# Patient Record
Sex: Male | Born: 1975 | Race: White | Hispanic: No | Marital: Married | Smoking: Former smoker
Health system: Southern US, Community
[De-identification: ages and names within clinical notes are randomized; demographics above are authoritative.]

## PROBLEM LIST (undated history)

## (undated) DIAGNOSIS — E061 Subacute thyroiditis: Secondary | ICD-10-CM

## (undated) DIAGNOSIS — K56609 Unspecified intestinal obstruction, unspecified as to partial versus complete obstruction: Secondary | ICD-10-CM

## (undated) DIAGNOSIS — Z9852 Vasectomy status: Secondary | ICD-10-CM

## (undated) DIAGNOSIS — E782 Mixed hyperlipidemia: Secondary | ICD-10-CM

## (undated) HISTORY — DX: Unspecified intestinal obstruction, unspecified as to partial versus complete obstruction: K56.609

## (undated) HISTORY — PX: VASECTOMY: SHX75

## (undated) HISTORY — DX: Mixed hyperlipidemia: E78.2

## (undated) HISTORY — DX: Subacute thyroiditis: E06.1

## (undated) HISTORY — DX: Vasectomy status: Z98.52

## (undated) HISTORY — PX: TONSILLECTOMY: SUR1361

---

## 2001-08-22 HISTORY — PX: LASIK: SHX215

## 2012-09-04 ENCOUNTER — Encounter: Payer: Self-pay | Admitting: Family Medicine

## 2012-09-04 ENCOUNTER — Ambulatory Visit (INDEPENDENT_AMBULATORY_CARE_PROVIDER_SITE_OTHER): Payer: BC Managed Care – PPO | Admitting: Family Medicine

## 2012-09-04 VITALS — BP 130/84 | HR 73 | Temp 98.0°F | Ht 73.0 in | Wt 208.0 lb

## 2012-09-04 DIAGNOSIS — R040 Epistaxis: Secondary | ICD-10-CM

## 2012-09-04 DIAGNOSIS — Z23 Encounter for immunization: Secondary | ICD-10-CM

## 2012-09-04 NOTE — Assessment & Plan Note (Signed)
Benign, related to dry winter air + recent URI w/excessive blowing of nose leading to impaired healing. Reassured pt today. Discussed use of saline moisturizing spray, aquafor/ointment for barrier protection of nasal mucosa, and afrin twice daily for the next 3d.

## 2012-09-04 NOTE — Progress Notes (Signed)
Office Note 09/04/2012  CC:  Chief Complaint  Patient presents with  . Establish Care    nosebleeds daily, usually in winter, but worse this year    HPI:  Edwin Sparks is a 37 y.o. White male who is here to establish care and discuss nosebleeds. Patient's most recent primary MD: none Old records were not reviewed prior to or during today's visit.  Describes hx of nosebleeds in winter, when dry and only lasts a few days usually. This winter they have worse: for 3 weeks they have been daily.  They are always in right nostril and stop with 30 sec's of pressure.   No hx of trauma to nose.  No use of meds/sprays in nose.  Lately has had 2 wks of nasal congestion and this aggravated the condition due to excessive nose blowing.  Now, lately he notes spicy foods triggering nose bleeds. No known environmental allergens.  No bleeding noted in stool or urine. No HF of clotting dysfunction.   Past Medical History  Diagnosis Date  . Chicken pox     Past Surgical History  Procedure Date  . Lasik 2003    Family History  Problem Relation Age of Onset  . Lymphoma Father 23    NHL; died age 14    History   Social History  . Marital Status: Married    Spouse Name: N/A    Number of Children: N/A  . Years of Education: N/A   Occupational History  . Not on file.   Social History Main Topics  . Smoking status: Former Smoker    Quit date: 08/22/2001  . Smokeless tobacco: Never Used  . Alcohol Use: Yes     Comment: wine with dinner  . Drug Use: No  . Sexually Active: Not on file   Other Topics Concern  . Not on file   Social History Narrative   Married, has one son and one daughter.Relocated to [facility] 2006.  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  No Tob, 1 glass of wine with dinner.  No drugs.Exercises 4 d/week--cardio and wt lifting.    Outpatient Encounter Prescriptions as of 09/04/2012  Medication Sig Dispense Refill  .  Omega-3 Fatty Acids (FISH OIL) 1000 MG CAPS Take 1 capsule by mouth daily.        No Known Allergies  ROS Review of Systems  Constitutional: Negative for fever and fatigue.  HENT: Positive for congestion (see HPI). Negative for sore throat and sinus pressure.   Eyes: Negative for visual disturbance.  Respiratory: Negative for cough.   Cardiovascular: Negative for chest pain.  Gastrointestinal: Negative for nausea, abdominal pain, blood in stool and anal bleeding.  Genitourinary: Negative for dysuria and hematuria.  Musculoskeletal: Negative for back pain and joint swelling.  Skin: Negative for rash.  Neurological: Negative for weakness and headaches.  Hematological: Negative for adenopathy.    PE; Blood pressure 130/84, pulse 73, temperature 98 F (36.7 C), temperature source Temporal, height 6\' 1"  (1.854 m), weight 208 lb (94.348 kg), SpO2 99.00%. Gen: Alert, well appearing.  Patient is oriented to person, place, time, and situation. AFFECT: pleasant, lucid thought and speech. ENT: Ears: EACs clear, normal epithelium.  TMs with good light reflex and landmarks bilaterally.  Eyes: no injection, icteris, swelling, or exudate.  EOMI, PERRLA. Nose: no drainage or turbinate edema/swelling.  No injection or focal lesion--I see no dried blood or active bleeding.  The nasal mucosa is a bit dry  diffusely.  Mouth: lips without lesion/swelling.  Oral mucosa pink and moist.  Dentition intact and without obvious caries or gingival swelling.  Oropharynx without erythema, exudate, or swelling.  CV: RRR, no m/r/g.   LUNGS: CTA bilat, nonlabored resps, good aeration in all lung fields.  Pertinent labs:  none  ASSESSMENT AND PLAN:   New pt: no old records to obtain.  Epistaxis, recurrent Benign, related to dry winter air + recent URI w/excessive blowing of nose leading to impaired healing. Reassured pt today. Discussed use of saline moisturizing spray, aquafor/ointment for barrier protection of  nasal mucosa, and afrin twice daily for the next 3d.  Flu vaccine IM today.  Tdap next visit at CPE.  An After Visit Summary was printed and given to the patient.  Return for Appt for CPE with fasting labs at patient's convenience.

## 2012-09-04 NOTE — Patient Instructions (Signed)
Buy OTC saline nasal spray: spray 2-3 sprays each side of nose and don't blow (repeat 2-3 times per day). Buy aquafor or similar ointment and use q-tip to apply to inside lining of the nostril 1-2 times a day. Buy OTC generic afrin (oxymetazolone) and use 2 sprays in each nostril twice daily for 3d.

## 2012-09-13 ENCOUNTER — Ambulatory Visit (INDEPENDENT_AMBULATORY_CARE_PROVIDER_SITE_OTHER): Payer: BC Managed Care – PPO | Admitting: Family Medicine

## 2012-09-13 ENCOUNTER — Encounter: Payer: Self-pay | Admitting: Family Medicine

## 2012-09-13 VITALS — BP 116/77 | HR 58 | Temp 98.0°F | Ht 73.0 in | Wt 208.0 lb

## 2012-09-13 DIAGNOSIS — Z0000 Encounter for general adult medical examination without abnormal findings: Secondary | ICD-10-CM | POA: Insufficient documentation

## 2012-09-13 DIAGNOSIS — Z23 Encounter for immunization: Secondary | ICD-10-CM

## 2012-09-13 LAB — COMPREHENSIVE METABOLIC PANEL
ALT: 17 U/L (ref 0–53)
AST: 22 U/L (ref 0–37)
Albumin: 4.9 g/dL (ref 3.5–5.2)
BUN: 15 mg/dL (ref 6–23)
CO2: 30 mEq/L (ref 19–32)
Calcium: 9.8 mg/dL (ref 8.4–10.5)
Chloride: 103 mEq/L (ref 96–112)
Creatinine, Ser: 1 mg/dL (ref 0.4–1.5)
GFR: 85.09 mL/min (ref >60.00)
Potassium: 4 mEq/L (ref 3.5–5.1)

## 2012-09-13 LAB — CBC WITH DIFFERENTIAL/PLATELET
Basophils Absolute: 0 10*3/uL (ref 0.0–0.1)
Eosinophils Absolute: 0.1 10*3/uL (ref 0.0–0.7)
Lymphocytes Relative: 26 % (ref 12.0–46.0)
MCHC: 34 g/dL (ref 30.0–36.0)
MCV: 90.1 fl (ref 78.0–100.0)
Monocytes Absolute: 0.6 10*3/uL (ref 0.1–1.0)
Neutrophils Relative %: 62.1 % (ref 43.0–77.0)
RDW: 13.3 % (ref 11.5–14.6)

## 2012-09-13 LAB — LIPID PANEL
Cholesterol: 177 mg/dL (ref 0–200)
LDL Cholesterol: 112 mg/dL — ABNORMAL HIGH (ref 0–99)
Total CHOL/HDL Ratio: 4
Triglycerides: 111 mg/dL (ref 0.0–149.0)

## 2012-09-13 NOTE — Patient Instructions (Signed)
Health Maintenance, Males A healthy lifestyle and preventative care can promote health and wellness.  Maintain regular health, dental, and eye exams.  Eat a healthy diet. Foods like vegetables, fruits, whole grains, low-fat dairy products, and lean protein foods contain the nutrients you need without too many calories. Decrease your intake of foods high in solid fats, added sugars, and salt. Get information about a proper diet from your caregiver, if necessary.  Regular physical exercise is one of the most important things you can do for your health. Most adults should get at least 150 minutes of moderate-intensity exercise (any activity that increases your heart rate and causes you to sweat) each week. In addition, most adults need muscle-strengthening exercises on 2 or more days a week.   Maintain a healthy weight. The body mass index (BMI) is a screening tool to identify possible weight problems. It provides an estimate of body fat based on height and weight. Your caregiver can help determine your BMI, and can help you achieve or maintain a healthy weight. For adults 20 years and older:  A BMI below 18.5 is considered underweight.  A BMI of 18.5 to 24.9 is normal.  A BMI of 25 to 29.9 is considered overweight.  A BMI of 30 and above is considered obese.  Maintain normal blood lipids and cholesterol by exercising and minimizing your intake of saturated fat. Eat a balanced diet with plenty of fruits and vegetables. Blood tests for lipids and cholesterol should begin at age 20 and be repeated every 5 years. If your lipid or cholesterol levels are high, you are over 50, or you are a high risk for heart disease, you may need your cholesterol levels checked more frequently.Ongoing high lipid and cholesterol levels should be treated with medicines, if diet and exercise are not effective.  If you smoke, find out from your caregiver how to quit. If you do not use tobacco, do not start.  If you  choose to drink alcohol, do not exceed 2 drinks per day. One drink is considered to be 12 ounces (355 mL) of beer, 5 ounces (148 mL) of wine, or 1.5 ounces (44 mL) of liquor.  Avoid use of street drugs. Do not share needles with anyone. Ask for help if you need support or instructions about stopping the use of drugs.  High blood pressure causes heart disease and increases the risk of stroke. Blood pressure should be checked at least every 1 to 2 years. Ongoing high blood pressure should be treated with medicines if weight loss and exercise are not effective.  If you are 45 to 38 years old, ask your caregiver if you should take aspirin to prevent heart disease.  Diabetes screening involves taking a blood sample to check your fasting blood sugar level. This should be done once every 3 years, after age 45, if you are within normal weight and without risk factors for diabetes. Testing should be considered at a younger age or be carried out more frequently if you are overweight and have at least 1 risk factor for diabetes.  Colorectal cancer can be detected and often prevented. Most routine colorectal cancer screening begins at the age of 50 and continues through age 75. However, your caregiver may recommend screening at an earlier age if you have risk factors for colon cancer. On a yearly basis, your caregiver may provide home test kits to check for hidden blood in the stool. Use of a small camera at the end of a tube,   to directly examine the colon (sigmoidoscopy or colonoscopy), can detect the earliest forms of colorectal cancer. Talk to your caregiver about this at age 50, when routine screening begins. Direct examination of the colon should be repeated every 5 to 10 years through age 75, unless early forms of pre-cancerous polyps or small growths are found.  Hepatitis C blood testing is recommended for all people born from 1945 through 1965 and any individual with known risks for hepatitis C.  Healthy  men should no longer receive prostate-specific antigen (PSA) blood tests as part of routine cancer screening. Consult with your caregiver about prostate cancer screening.  Testicular cancer screening is not recommended for adolescents or adult males who have no symptoms. Screening includes self-exam, caregiver exam, and other screening tests. Consult with your caregiver about any symptoms you have or any concerns you have about testicular cancer.  Practice safe sex. Use condoms and avoid high-risk sexual practices to reduce the spread of sexually transmitted infections (STIs).  Use sunscreen with a sun protection factor (SPF) of 30 or greater. Apply sunscreen liberally and repeatedly throughout the day. You should seek shade when your shadow is shorter than you. Protect yourself by wearing long sleeves, pants, a wide-brimmed hat, and sunglasses year round, whenever you are outdoors.  Notify your caregiver of new moles or changes in moles, especially if there is a change in shape or color. Also notify your caregiver if a mole is larger than the size of a pencil eraser.  A one-time screening for abdominal aortic aneurysm (AAA) and surgical repair of large AAAs by sound wave imaging (ultrasonography) is recommended for ages 65 to 75 years who are current or former smokers.  Stay current with your immunizations. Document Released: 02/04/2008 Document Revised: 10/31/2011 Document Reviewed: 01/03/2011 Exit[employer] Patient Information 2013 [employer].  

## 2012-09-13 NOTE — Assessment & Plan Note (Signed)
Reviewed age and gender appropriate health maintenance issues (prudent diet, regular exercise, health risks of tobacco and excessive alcohol, use of seatbelts, fire alarms in home, use of sunscreen).  Also reviewed age and gender appropriate health screening as well as vaccine recommendations. Tdap today. Health panel labs today.

## 2012-09-13 NOTE — Progress Notes (Signed)
Office Note 09/13/2012  CC:  Chief Complaint  Patient presents with  . Annual Exam    no problems, nosebleeds better, fasting labs    HPI:  Edwin Sparks is a 37 y.o. White male who is here for CPE, fasting labs. Nosebleeds better, doing saline regularly now.  Used afrin x 3 d initially.  Humidifier in room. He is fasting today.  No acute complaints.   Past Medical History  Diagnosis Date  . Chicken pox     Past Surgical History  Procedure Date  . Lasik 2003    Family History  Problem Relation Age of Onset  . Lymphoma Father 35    NHL; died age 31    History   Social History  . Marital Status: Married    Spouse Name: N/A    Number of Children: N/A  . Years of Education: N/A   Occupational History  . Not on file.   Social History Main Topics  . Smoking status: Former Smoker    Quit date: 08/22/2001  . Smokeless tobacco: Never Used  . Alcohol Use: Yes     Comment: wine with dinner  . Drug Use: No  . Sexually Active: Not on file   Other Topics Concern  . Not on file   Social History Narrative   Married, has one son and one daughter.Relocated to [facility] 2006.  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  No Tob, 1 glass of wine with dinner.  No drugs.Exercises 4 d/week--cardio and wt lifting.    Outpatient Prescriptions Prior to Visit  Medication Sig Dispense Refill  . Omega-3 Fatty Acids (FISH OIL) 1000 MG CAPS Take 1 capsule by mouth daily.      Last reviewed on 09/13/2012  9:05 AM by Jeoffrey Massed, MD  No Known Allergies  ROS Review of Systems  Constitutional: Negative for fever, chills, appetite change and fatigue.  HENT: Negative for ear pain, congestion, sore throat, neck stiffness and dental problem.   Eyes: Negative for discharge, redness and visual disturbance.  Respiratory: Negative for cough, chest tightness, shortness of breath and wheezing.   Cardiovascular: Negative for chest pain,  palpitations and leg swelling.  Gastrointestinal: Negative for nausea, vomiting, abdominal pain, diarrhea and blood in stool.  Genitourinary: Negative for dysuria, urgency, frequency, hematuria, flank pain and difficulty urinating.  Musculoskeletal: Negative for myalgias, back pain, joint swelling and arthralgias.  Skin: Negative for pallor and rash.  Neurological: Negative for dizziness, speech difficulty, weakness and headaches.  Hematological: Negative for adenopathy. Does not bruise/bleed easily.  Psychiatric/Behavioral: Negative for confusion and sleep disturbance. The patient is not nervous/anxious.     PE; Blood pressure 116/77, pulse 58, temperature 98 F (36.7 C), temperature source Temporal, height 6\' 1"  (1.854 m), weight 208 lb (94.348 kg), SpO2 100.00%. Gen: Alert, well appearing.  Patient is oriented to person, place, time, and situation. AFFECT: pleasant, lucid thought and speech. ENT: Ears: EACs clear, normal epithelium.  TMs with good light reflex and landmarks bilaterally.  Eyes: no injection, icteris, swelling, or exudate.  EOMI, PERRLA. Nose: no drainage or turbinate edema/swelling.  No injection or focal lesion.  Mouth: lips without lesion/swelling.  Oral mucosa pink and moist.  Dentition intact and without obvious caries or gingival swelling.  Oropharynx without erythema, exudate, or swelling.  Neck: supple/nontender.  No LAD, mass, or TM.  Carotid pulses 2+ bilaterally, without bruits. CV: RRR, no m/r/g.   LUNGS: CTA bilat, nonlabored resps, good aeration  in all lung fields. ABD: soft, NT, ND, BS normal.  No hepatospenomegaly or mass.  No bruits. EXT: no clubbing, cyanosis, or edema.  Musculoskeletal: no joint swelling, erythema, warmth, or tend2erness.  ROM of all joints intact. Skin - no sores or suspicious lesions or rashes or color changes Genitals normal; both testes normal without tenderness, masses, hydroceles, varicoceles, erythema or swelling. Shaft normal,  circumcised, meatus normal without discharge. No inguinal hernia noted. No inguinal lymphadenopathy.  Pertinent labs:  None today  ASSESSMENT AND PLAN:   Health maintenance examination Reviewed age and gender appropriate health maintenance issues (prudent diet, regular exercise, health risks of tobacco and excessive alcohol, use of seatbelts, fire alarms in home, use of sunscreen).  Also reviewed age and gender appropriate health screening as well as vaccine recommendations. Tdap today. Health panel labs today.   An After Visit Summary was printed and given to the patient.  FOLLOW UP:  No Follow-up on file.

## 2013-06-27 ENCOUNTER — Other Ambulatory Visit: Payer: Self-pay

## 2013-08-22 DIAGNOSIS — Z9852 Vasectomy status: Secondary | ICD-10-CM

## 2013-08-22 HISTORY — DX: Vasectomy status: Z98.52

## 2014-02-07 ENCOUNTER — Ambulatory Visit (INDEPENDENT_AMBULATORY_CARE_PROVIDER_SITE_OTHER): Payer: 59 | Admitting: Nurse Practitioner

## 2014-02-07 ENCOUNTER — Encounter: Payer: Self-pay | Admitting: Nurse Practitioner

## 2014-02-07 VITALS — BP 113/72 | HR 91 | Temp 97.9°F | Resp 18 | Ht 73.0 in | Wt 212.0 lb

## 2014-02-07 DIAGNOSIS — H6092 Unspecified otitis externa, left ear: Secondary | ICD-10-CM

## 2014-02-07 DIAGNOSIS — H60399 Other infective otitis externa, unspecified ear: Secondary | ICD-10-CM

## 2014-02-07 MED ORDER — NEOMYCIN-POLYMYXIN-HC 1 % OT SOLN: 3.0000 [drp] | Freq: Four times a day (QID) | OTIC | Status: DC

## 2014-02-07 NOTE — Patient Instructions (Signed)
Keep ear dry-cottonball in ear when getting in shower. Remove when get out. No swimming for 1 week. Use ear drops 4 times daily for 7-10 days. Keep head tilted for 5 minutes. Let us know if not better within a week.  Otitis Externa Otitis externa is a bacterial or fungal infection of the outer ear canal. This is the area from the eardrum to the outside of the ear. Otitis externa is sometimes called "swimmer's ear." CAUSES  Possible causes of infection include:  Swimming in dirty water.  Moisture remaining in the ear after swimming or bathing.  Mild injury (trauma) to the ear.  Objects stuck in the ear (foreign body).  Cuts or scrapes (abrasions) on the outside of the ear. SYMPTOMS  The first symptom of infection is often itching in the ear canal. Later signs and symptoms may include swelling and redness of the ear canal, ear pain, and yellowish-white fluid (pus) coming from the ear. The ear pain may be worse when pulling on the earlobe. DIAGNOSIS  Your caregiver will perform a physical exam. A sample of fluid may be taken from the ear and examined for bacteria or fungi. TREATMENT  Antibiotic ear drops are often given for 10 to 14 days. Treatment may also include pain medicine or corticosteroids to reduce itching and swelling. PREVENTION   Keep your ear dry. Use the corner of a towel to absorb water out of the ear canal after swimming or bathing.  Avoid scratching or putting objects inside your ear. This can damage the ear canal or remove the protective wax that lines the canal. This makes it easier for bacteria and fungi to grow.  Avoid swimming in lakes, polluted water, or poorly chlorinated pools.  You may use ear drops made of rubbing alcohol and vinegar after swimming. Combine equal parts of white vinegar and alcohol in a bottle. Put 3 or 4 drops into each ear after swimming. HOME CARE INSTRUCTIONS   Apply antibiotic ear drops to the ear canal as prescribed by your  caregiver.  Only take over-the-counter or prescription medicines for pain, discomfort, or fever as directed by your caregiver.  If you have diabetes, follow any additional treatment instructions from your caregiver.  Keep all follow-up appointments as directed by your caregiver. SEEK MEDICAL CARE IF:   You have a fever.  Your ear is still red, swollen, painful, or draining pus after 3 days.  Your redness, swelling, or pain gets worse.  You have a severe headache.  You have redness, swelling, pain, or tenderness in the area behind your ear. MAKE SURE YOU:   Understand these instructions.  Will watch your condition.  Will get help right away if you are not doing well or get worse. Document Released: 08/08/2005 Document Revised: 10/31/2011 Document Reviewed: 08/25/2011 [facility] Patient Information 2015 [place], [place]. This information is not intended to replace advice given to you by your health care provider. Make sure you discuss any questions you have with your health care provider.

## 2014-02-07 NOTE — Progress Notes (Signed)
   Subjective:    Patient ID: Edwin Sparks, male    DOB: 06/29/1976, 38 y.o.   MRN: [id number]  Otalgia  There is pain in the left ear. This is a new problem. The current episode started in the past 7 days. The problem occurs constantly. The problem has been unchanged. There has been no fever. The pain is mild. Pertinent negatives include no abdominal pain, coughing, diarrhea, drainage, ear discharge, headaches, hearing loss or sore throat. He has tried nothing for the symptoms. swimming a lot      Review of Systems  Constitutional: Negative for fever, chills, activity change, appetite change and fatigue.  HENT: Positive for ear pain. Negative for congestion, ear discharge, hearing loss, postnasal drip, sore throat, tinnitus and voice change.   Respiratory: Negative for cough.   Gastrointestinal: Negative for abdominal pain and diarrhea.  Neurological: Negative for headaches.       Objective:   Physical Exam  Vitals reviewed. Constitutional: He is oriented to person, place, and time. He appears well-developed and well-nourished. No distress.  HENT:  Head: Normocephalic and atraumatic.  Right Ear: External ear normal.  Mouth/Throat: Oropharynx is clear and moist. No oropharyngeal exudate.  L canal erythematous & swollen. Scant gold crust. Tender.  Eyes: Conjunctivae are normal. Right eye exhibits no discharge. Left eye exhibits no discharge.  Cardiovascular: Normal rate.   No murmur heard. Pulmonary/Chest: Effort normal. No respiratory distress.  Neurological: He is alert and oriented to person, place, and time.  Skin: Skin is warm and dry.  Psychiatric: He has a normal mood and affect. His behavior is normal. Thought content normal.          Assessment & Plan:  1. Otitis externa of left ear - NEOMYCIN-POLYMYXIN-HYDROCORTISONE (CORTISPORIN) 1 % SOLN otic solution; Place 3 drops into both ears 4 (four) times daily.  Dispense: 10 mL; Refill: 0 See pt instructions.

## 2014-02-20 ENCOUNTER — Other Ambulatory Visit (INDEPENDENT_AMBULATORY_CARE_PROVIDER_SITE_OTHER): Payer: 59

## 2014-02-20 DIAGNOSIS — Z0000 Encounter for general adult medical examination without abnormal findings: Secondary | ICD-10-CM

## 2014-02-20 LAB — COMPREHENSIVE METABOLIC PANEL
ALBUMIN: 4.8 g/dL (ref 3.5–5.2)
ALK PHOS: 60 U/L (ref 39–117)
ALT: 17 U/L (ref 0–53)
AST: 21 U/L (ref 0–37)
BUN: 12 mg/dL (ref 6–23)
CO2: 29 mEq/L (ref 19–32)
Calcium: 9.9 mg/dL (ref 8.4–10.5)
Chloride: 103 mEq/L (ref 96–112)
Creatinine, Ser: 1 mg/dL (ref 0.4–1.5)
GFR: 84.45 mL/min (ref >60.00)
Glucose, Bld: 90 mg/dL (ref 70–99)
Potassium: 4.3 mEq/L (ref 3.5–5.1)
Sodium: 140 mEq/L (ref 135–145)
Total Bilirubin: 0.8 mg/dL (ref 0.2–1.2)
Total Protein: 7.6 g/dL (ref 6.0–8.3)

## 2014-02-20 LAB — LIPID PANEL
CHOL/HDL RATIO: 5
Cholesterol: 216 mg/dL — ABNORMAL HIGH (ref 0–200)
HDL: 44.1 mg/dL (ref >39.00)
LDL CALC: 151 mg/dL — AB (ref 0–99)
NonHDL: 171.9
Triglycerides: 105 mg/dL (ref 0.0–149.0)
VLDL: 21 mg/dL (ref 0.0–40.0)

## 2014-02-20 LAB — CBC WITH DIFFERENTIAL/PLATELET
BASOS PCT: 0.4 % (ref 0.0–3.0)
Basophils Absolute: 0 10*3/uL (ref 0.0–0.1)
EOS PCT: 1.7 % (ref 0.0–5.0)
Eosinophils Absolute: 0.1 10*3/uL (ref 0.0–0.7)
HEMATOCRIT: 42.5 % (ref 39.0–52.0)
Hemoglobin: 14.5 g/dL (ref 13.0–17.0)
LYMPHS ABS: 1.3 10*3/uL (ref 0.7–4.0)
Lymphocytes Relative: 25.8 % (ref 12.0–46.0)
MCHC: 34.2 g/dL (ref 30.0–36.0)
MCV: 89.5 fl (ref 78.0–100.0)
MONO ABS: 0.5 10*3/uL (ref 0.1–1.0)
Monocytes Relative: 10.7 % (ref 3.0–12.0)
NEUTROS ABS: 3.2 10*3/uL (ref 1.4–7.7)
Neutrophils Relative %: 61.4 % (ref 43.0–77.0)
PLATELETS: 177 10*3/uL (ref 150.0–400.0)
RBC: 4.75 Mil/uL (ref 4.22–5.81)
RDW: 13.3 % (ref 11.5–15.5)
WBC: 5.2 10*3/uL (ref 4.0–10.5)

## 2014-02-20 LAB — TSH: TSH: 2.55 u[IU]/mL (ref 0.35–4.50)

## 2014-02-24 ENCOUNTER — Ambulatory Visit (INDEPENDENT_AMBULATORY_CARE_PROVIDER_SITE_OTHER): Payer: 59 | Admitting: Family Medicine

## 2014-02-24 ENCOUNTER — Encounter: Payer: Self-pay | Admitting: Family Medicine

## 2014-02-24 VITALS — BP 135/83 | HR 75 | Temp 98.0°F | Ht 73.0 in | Wt 213.0 lb

## 2014-02-24 DIAGNOSIS — Z0000 Encounter for general adult medical examination without abnormal findings: Secondary | ICD-10-CM

## 2014-02-24 MED ORDER — HYDROCORTISONE 2.5 % RE CREA: 1.0000 "application " | TOPICAL_CREAM | Freq: Two times a day (BID) | RECTAL | Status: DC

## 2014-02-24 NOTE — Progress Notes (Signed)
Pre visit review using our clinic review tool, if applicable. No additional management support is needed unless otherwise documented below in the visit note. 

## 2014-02-24 NOTE — Assessment & Plan Note (Addendum)
Reviewed age and gender appropriate health maintenance issues (prudent diet, regular exercise, health risks of tobacco and excessive alcohol, use of seatbelts, fire alarms in home, use of sunscreen).  Also reviewed age and gender appropriate health screening as well as vaccine recommendations. HP labs reviewed, all ok but pt encouraged to make TLC a priority to get lipid panel better. Regarding his description of his external hemorrhoid in the past, I encouraged him to avoid excessive intra-abdominal pressure (heavy lifting, constipation, etc) and I rx'd anusol HC 2.5% rectal cream to use bid prn.

## 2014-02-24 NOTE — Progress Notes (Signed)
Office Note 02/24/2014  CC:  Chief Complaint  Patient presents with  . Annual Exam    HPI:  Edwin DowningBenjamin A Encinas is a 38 y.o. White male who is here for CPE. Recent fasting HP labs were reviewed with pt in detail today.  Has c/o intermittent prob with moderately painful external hemorrhoid that does not bleed. Uses OTC cream with minimal success.  Says he currently doesn't feel any soreness in swelling in the area. Has never had painless rectal bleeding. No constipation.  History reviewed. No pertinent past medical history.  Past Surgical History  Procedure Laterality Date  . Lasik  2003    Family History  Problem Relation Age of Onset  . Lymphoma Father [id number]    NHL; died age 39    History   Social History  . Marital Status: Married    Spouse Name: N/A    Number of Children: N/A  . Years of Education: N/A   Occupational History  . Not on file.   Social History Main Topics  . Smoking status: Former Smoker    Quit date: 08/22/2001  . Smokeless tobacco: Never Used  . Alcohol Use: Yes     Comment: wine with dinner  . Drug Use: No  . Sexual Activity: Not on file   Other Topics Concern  . Not on file   Social History Narrative   Married, has one son and one daughter.   [place]     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   [place]     No Tob, 1 glass of wine with dinner.  No drugs.   Exercises 4 d/week--cardio and wt lifting.   MEDS: none  No Known Allergies  ROS Review of Systems  Constitutional: Negative for fever, chills, appetite change and fatigue.  HENT: Negative for congestion, dental problem, ear pain and sore throat.   Eyes: Negative for discharge, redness and visual disturbance.  Respiratory: Negative for cough, chest tightness, shortness of breath and wheezing.   Cardiovascular: Negative for chest pain, palpitations and leg swelling.  Gastrointestinal: Positive for rectal pain (on/off  external hemorrhoid that is painful but does not bleed). Negative for nausea, vomiting, abdominal pain, diarrhea and blood in stool.  Genitourinary: Negative for dysuria, urgency, frequency, hematuria, flank pain and difficulty urinating.  Musculoskeletal: Negative for arthralgias, back pain, joint swelling, myalgias and neck stiffness.  Skin: Negative for pallor and rash.  Neurological: Negative for dizziness, speech difficulty, weakness and headaches.  Hematological: Negative for adenopathy. Does not bruise/bleed easily.  Psychiatric/Behavioral: Negative for confusion and sleep disturbance. The patient is not nervous/anxious.     PE; Blood pressure 135/83, pulse 75, temperature 98 F (36.7 C), temperature source Temporal, height 6\' 1"  (1.854 m), weight 213 lb (96.616 kg), SpO2 100.00%.  BMI 28 Gen: Alert, well appearing.  Patient is oriented to person, place, time, and situation. AFFECT: pleasant, lucid thought and speech. ENT: Ears: EACs clear, normal epithelium.  TMs with good light reflex and landmarks bilaterally.  Eyes: no injection, icteris, swelling, or exudate.  EOMI, PERRLA. Nose: no drainage or turbinate edema/swelling.  No injection or focal lesion.  Mouth: lips without lesion/swelling.  Oral mucosa pink and moist.  Dentition intact and without obvious caries or gingival swelling.  Oropharynx without erythema, exudate, or swelling.  Neck: supple/nontender.  No LAD, mass, or TM.  Carotid pulses 2+ bilaterally, without bruits. CV: RRR, no m/r/g.   LUNGS: CTA bilat, nonlabored resps, good  aeration in all lung fields. ABD: soft, NT, ND, BS normal.  No hepatospenomegaly or mass.  No bruits. EXT: no clubbing, cyanosis, or edema.  Musculoskeletal: no joint swelling, erythema, warmth, or tenderness.  ROM of all joints intact. Skin - no sores or suspicious lesions or rashes or color changes Rectal: I can not see any external hemorrhoids at this time.  No anal fissure.    Pertinent labs:   Lab Results  Component Value Date   TSH 2.55 02/20/2014   Lab Results  Component Value Date   WBC 5.2 02/20/2014   HGB 14.5 02/20/2014   HCT 42.5 02/20/2014   MCV 89.5 02/20/2014   PLT 177.0 02/20/2014   Lab Results  Component Value Date   CREATININE 1.0 02/20/2014   BUN 12 02/20/2014   NA 140 02/20/2014   K 4.3 02/20/2014   CL 103 02/20/2014   CO2 29 02/20/2014   Lab Results  Component Value Date   ALT 17 02/20/2014   AST 21 02/20/2014   ALKPHOS 60 02/20/2014   BILITOT 0.8 02/20/2014   Lab Results  Component Value Date   CHOL 216* 02/20/2014   Lab Results  Component Value Date   HDL 44.10 02/20/2014   Lab Results  Component Value Date   LDLCALC 151* 02/20/2014   Lab Results  Component Value Date   TRIG 105.0 02/20/2014   Lab Results  Component Value Date   CHOLHDL 5 02/20/2014   No results found for this basename: PSA   ASSESSMENT AND PLAN:   Health maintenance examination Reviewed age and gender appropriate health maintenance issues (prudent diet, regular exercise, health risks of tobacco and excessive alcohol, use of seatbelts, fire alarms in home, use of sunscreen).  Also reviewed age and gender appropriate health screening as well as vaccine recommendations. HP labs reviewed, all ok but pt encouraged to make TLC a priority to get lipid panel better. Regarding his description of his external hemorrhoid in the past, I encouraged him to avoid excessive intra-abdominal pressure (heavy lifting, constipation, etc) and I rx'd anusol HC 2.5% rectal cream to use bid prn.   An After Visit Summary was printed and given to the patient.  FOLLOW UP:  Return in about 1 year (around 02/25/2015) for annual CPE with fasting labs the week prior.

## 2014-06-22 DIAGNOSIS — K56609 Unspecified intestinal obstruction, unspecified as to partial versus complete obstruction: Secondary | ICD-10-CM

## 2014-06-22 HISTORY — DX: Unspecified intestinal obstruction, unspecified as to partial versus complete obstruction: K56.609

## 2014-07-02 ENCOUNTER — Emergency Department (HOSPITAL_BASED_OUTPATIENT_CLINIC_OR_DEPARTMENT_OTHER): Payer: 59

## 2014-07-02 ENCOUNTER — Inpatient Hospital Stay (HOSPITAL_BASED_OUTPATIENT_CLINIC_OR_DEPARTMENT_OTHER)
Admission: EM | Admit: 2014-07-02 | Discharge: 2014-07-05 | DRG: 390 | Disposition: A | Discharge status: Home / Self Care | Payer: 59 | Attending: General Surgery | Admitting: General Surgery

## 2014-07-02 ENCOUNTER — Encounter (HOSPITAL_BASED_OUTPATIENT_CLINIC_OR_DEPARTMENT_OTHER): Payer: Self-pay

## 2014-07-02 DIAGNOSIS — Z807 Family history of other malignant neoplasms of lymphoid, hematopoietic and related tissues: Secondary | ICD-10-CM

## 2014-07-02 DIAGNOSIS — E876 Hypokalemia: Secondary | ICD-10-CM | POA: Diagnosis present

## 2014-07-02 DIAGNOSIS — Z23 Encounter for immunization: Secondary | ICD-10-CM

## 2014-07-02 DIAGNOSIS — R109 Unspecified abdominal pain: Secondary | ICD-10-CM | POA: Diagnosis present

## 2014-07-02 DIAGNOSIS — K56609 Unspecified intestinal obstruction, unspecified as to partial versus complete obstruction: Secondary | ICD-10-CM

## 2014-07-02 DIAGNOSIS — Z87891 Personal history of nicotine dependence: Secondary | ICD-10-CM

## 2014-07-02 DIAGNOSIS — K5660 Unspecified intestinal obstruction: Principal | ICD-10-CM | POA: Diagnosis present

## 2014-07-02 LAB — URINE MICROSCOPIC-ADD ON

## 2014-07-02 LAB — CBC WITH DIFFERENTIAL/PLATELET
Basophils Absolute: 0 10*3/uL (ref 0.0–0.1)
Basophils Relative: 0 % (ref 0–1)
EOS PCT: 1 % (ref 0–5)
Eosinophils Absolute: 0.1 10*3/uL (ref 0.0–0.7)
HEMATOCRIT: 42.5 % (ref 39.0–52.0)
HEMOGLOBIN: 15.5 g/dL (ref 13.0–17.0)
LYMPHS ABS: 0.9 10*3/uL (ref 0.7–4.0)
LYMPHS PCT: 9 % — AB (ref 12–46)
MCH: 30.9 pg (ref 26.0–34.0)
MCHC: 36.5 g/dL — ABNORMAL HIGH (ref 30.0–36.0)
MCV: 84.8 fL (ref 78.0–100.0)
MONO ABS: 1.8 10*3/uL — AB (ref 0.1–1.0)
Monocytes Relative: 17 % — ABNORMAL HIGH (ref 3–12)
Neutro Abs: 7.8 10*3/uL — ABNORMAL HIGH (ref 1.7–7.7)
Neutrophils Relative %: 73 % (ref 43–77)
PLATELETS: 174 10*3/uL (ref 150–400)
RBC: 5.01 MIL/uL (ref 4.22–5.81)
RDW: 12.5 % (ref 11.5–15.5)
WBC: 10.6 10*3/uL — AB (ref 4.0–10.5)

## 2014-07-02 LAB — URINALYSIS, ROUTINE W REFLEX MICROSCOPIC
Glucose, UA: NEGATIVE mg/dL
Hgb urine dipstick: NEGATIVE
Ketones, ur: 15 mg/dL — AB
LEUKOCYTES UA: NEGATIVE
Nitrite: NEGATIVE
PROTEIN: 100 mg/dL — AB
Specific Gravity, Urine: 1.027 (ref 1.005–1.030)
UROBILINOGEN UA: 1 mg/dL (ref 0.0–1.0)
pH: 6 (ref 5.0–8.0)

## 2014-07-02 LAB — COMPREHENSIVE METABOLIC PANEL
ALT: 18 U/L (ref 0–53)
AST: 23 U/L (ref 0–37)
Albumin: 4.2 g/dL (ref 3.5–5.2)
Alkaline Phosphatase: 69 U/L (ref 39–117)
Anion gap: 14 (ref 5–15)
BUN: 17 mg/dL (ref 6–23)
CALCIUM: 10.1 mg/dL (ref 8.4–10.5)
CO2: 31 mEq/L (ref 19–32)
Chloride: 88 mEq/L — ABNORMAL LOW (ref 96–112)
Creatinine, Ser: 1.1 mg/dL (ref 0.50–1.35)
GFR, EST NON AFRICAN AMERICAN: 83 mL/min — AB (ref >90)
Glucose, Bld: 116 mg/dL — ABNORMAL HIGH (ref 70–99)
Potassium: 3.5 mEq/L — ABNORMAL LOW (ref 3.7–5.3)
SODIUM: 133 meq/L — AB (ref 137–147)
Total Bilirubin: 1.5 mg/dL — ABNORMAL HIGH (ref 0.3–1.2)
Total Protein: 8.1 g/dL (ref 6.0–8.3)

## 2014-07-02 LAB — LIPASE, BLOOD: Lipase: 80 U/L — ABNORMAL HIGH (ref 11–59)

## 2014-07-02 MED ORDER — IOHEXOL 300 MG/ML  SOLN
100.0000 mL | Freq: Once | INTRAMUSCULAR | Status: AC | PRN
  Administered 2014-07-02: 100 mL via INTRAVENOUS

## 2014-07-02 MED ORDER — ONDANSETRON HCL 4 MG/2ML IJ SOLN
4.0000 mg | Freq: Once | INTRAMUSCULAR | Status: AC
  Administered 2014-07-02: 4 mg via INTRAVENOUS
  Filled 2014-07-02: qty 2

## 2014-07-02 MED ORDER — MORPHINE SULFATE 4 MG/ML IJ SOLN
4.0000 mg | Freq: Once | INTRAMUSCULAR | Status: AC
  Administered 2014-07-02: 4 mg via INTRAVENOUS
  Filled 2014-07-02: qty 1

## 2014-07-02 MED ORDER — ONDANSETRON HCL 4 MG/2ML IJ SOLN
4.0000 mg | Freq: Four times a day (QID) | INTRAMUSCULAR | Status: DC | PRN
  Administered 2014-07-02 – 2014-07-03 (×4): 4 mg via INTRAVENOUS
  Filled 2014-07-02 (×4): qty 2

## 2014-07-02 MED ORDER — IOHEXOL 300 MG/ML  SOLN
25.0000 mL | Freq: Once | INTRAMUSCULAR | Status: AC | PRN
  Administered 2014-07-02: 25 mL via ORAL

## 2014-07-02 MED ORDER — SODIUM CHLORIDE 0.9 % IV BOLUS (SEPSIS)
1000.0000 mL | Freq: Once | INTRAVENOUS | Status: AC
  Administered 2014-07-02: 1000 mL via INTRAVENOUS

## 2014-07-02 MED ORDER — MORPHINE SULFATE 2 MG/ML IJ SOLN
2.0000 mg | INTRAMUSCULAR | Status: DC | PRN
  Administered 2014-07-02 – 2014-07-03 (×6): 2 mg via INTRAVENOUS
  Filled 2014-07-02 (×6): qty 1

## 2014-07-02 NOTE — ED Notes (Signed)
Report given to tiffany [facility]

## 2014-07-02 NOTE — H&P (Signed)
Edwin Sparks is an 38 y.o. male.   Chief Complaint: ab pain and inability to pass flatus/bloating HPI: 44 yom with no prior medical or surgical history who has been having symptoms since Saturday.  He drank a bottle of wine, 2 beers and then ate a bag of potato chips while watching [place] play. He woke up the next day "not feeling right".  This progressed to abdominal discomfort and decreased flatus. He became very bloated and had n/v.  Not been eating.  No fevers.  He came in today as this was not getting better. He was [facility] and transferred here after ct scan.  History reviewed. No pertinent past medical history.  Past Surgical History  Procedure Laterality Date  . Lasik  2003    Family History  Problem Relation Age of Onset  . Lymphoma Father 45    NHL; died age 68   Social History:  reports that he quit smoking about 12 years ago. He has never used smokeless tobacco. He reports that he drinks alcohol. He reports that he does not use illicit drugs.  Allergies: No Known Allergies meds none  Results for orders placed or performed during the hospital encounter of 07/02/14 (from the past 48 hour(s))  Urinalysis, Routine w reflex microscopic     Status: Abnormal   Collection Time: 07/02/14  4:16 PM  Result Value Ref Range   Color, Urine AMBER (A) YELLOW    Comment: BIOCHEMICALS MAY BE AFFECTED BY COLOR   APPearance CLEAR CLEAR   Specific Gravity, Urine 1.027 1.005 - 1.030   pH 6.0 5.0 - 8.0   Glucose, UA NEGATIVE NEGATIVE mg/dL   Hgb urine dipstick NEGATIVE NEGATIVE   Bilirubin Urine SMALL (A) NEGATIVE   Ketones, ur 15 (A) NEGATIVE mg/dL   Protein, ur 100 (A) NEGATIVE mg/dL   Urobilinogen, UA 1.0 0.0 - 1.0 mg/dL   Nitrite NEGATIVE NEGATIVE   Leukocytes, UA NEGATIVE NEGATIVE  Urine microscopic-add on     Status: None   Collection Time: 07/02/14  4:16 PM  Result Value Ref Range   Squamous Epithelial / LPF RARE RARE   RBC / HPF 0-2 <3 RBC/hpf   Bacteria, UA RARE RARE   Urine-Other MUCOUS PRESENT   CBC with Differential     Status: Abnormal   Collection Time: 07/02/14  5:26 PM  Result Value Ref Range   WBC 10.6 (H) 4.0 - 10.5 K/uL   RBC 5.01 4.22 - 5.81 MIL/uL   Hemoglobin 15.5 13.0 - 17.0 g/dL   HCT 42.5 39.0 - 52.0 %   MCV 84.8 78.0 - 100.0 fL   MCH 30.9 26.0 - 34.0 pg   MCHC 36.5 (H) 30.0 - 36.0 g/dL   RDW 12.5 11.5 - 15.5 %   Platelets 174 150 - 400 K/uL   Neutrophils Relative % 73 43 - 77 %   Neutro Abs 7.8 (H) 1.7 - 7.7 K/uL   Lymphocytes Relative 9 (L) 12 - 46 %   Lymphs Abs 0.9 0.7 - 4.0 K/uL   Monocytes Relative 17 (H) 3 - 12 %   Monocytes Absolute 1.8 (H) 0.1 - 1.0 K/uL   Eosinophils Relative 1 0 - 5 %   Eosinophils Absolute 0.1 0.0 - 0.7 K/uL   Basophils Relative 0 0 - 1 %   Basophils Absolute 0.0 0.0 - 0.1 K/uL  Comprehensive metabolic panel     Status: Abnormal   Collection Time: 07/02/14  5:26 PM  Result Value Ref Range   Sodium 133 (L) 137 - 147 mEq/L   Potassium 3.5 (L) 3.7 - 5.3 mEq/L   Chloride 88 (L) 96 - 112 mEq/L   CO2 31 19 - 32 mEq/L   Glucose, Bld 116 (H) 70 - 99 mg/dL   BUN 17 6 - 23 mg/dL   Creatinine, Ser 1.10 0.50 - 1.35 mg/dL   Calcium 10.1 8.4 - 10.5 mg/dL   Total Protein 8.1 6.0 - 8.3 g/dL   Albumin 4.2 3.5 - 5.2 g/dL   AST 23 0 - 37 U/L   ALT 18 0 - 53 U/L   Alkaline Phosphatase 69 39 - 117 U/L   Total Bilirubin 1.5 (H) 0.3 - 1.2 mg/dL   GFR calc non Af Amer 83 (L) >90 mL/min   GFR calc Af Amer >90 >90 mL/min    Comment: (NOTE) The eGFR has been calculated using the CKD EPI equation. This calculation has not been validated in all clinical situations. eGFR's persistently <90 mL/min signify possible Chronic Kidney Disease.    Anion gap 14 5 - 15  Lipase, blood     Status: Abnormal   Collection Time: 07/02/14  5:26 PM  Result Value Ref Range   Lipase 80 (H) 11 - 59 U/L   Ct Abdomen Pelvis W Contrast  07/02/2014   CLINICAL DATA:  Lower abdominal pain, nausea and vomiting.  No abdominal surgery.  EXAM: CT ABDOMEN AND PELVIS WITH CONTRAST  TECHNIQUE: Multidetector CT imaging of the abdomen and pelvis was performed using the standard protocol following bolus administration of intravenous contrast.  CONTRAST:  45m OMNIPAQUE IOHEXOL 300 MG/ML SOLN, 1049mOMNIPAQUE IOHEXOL 300 MG/ML SOLN  COMPARISON:  Radiographs 07/02/2014  FINDINGS: Lower chest:  The lung bases are clear.  No pleural fluid.  Hepatobiliary: No focal hepatic lesion. The gallbladder is normal. No biliary duct dilatation.  Pancreas: Pancreas is normal. No duct dilatation. No pancreatic inflammation.  Spleen: Normal spleen.  Adrenals/urinary tract: Normal adrenal glands. Kidneys, ureters, bladder normal.  Stomach/Bowel: Stomach is normal. The duodenum is normal. The proximal small bowel is dilated up to 4.5 cm. There is poor progression of the ingested oral contrast. The mid small bowel is also dilated and fluid-filled. The distal small bowel leading up to the terminal ileum is collapsed measuring 1 cm. No discrete transition from the dilated proximal small bowel to the collapsed distal small bowel is identified. There is no pneumatosis of the small bowel. No portal venous gas. No intraperitoneal free air.  Colon is collapsed. The appendix is not clearly identified. Rectum is normal.  Vascular/Lymphatic: Abdominal aorta is normal caliber. There is no retroperitoneal or periportal lymphadenopathy. No pelvic lymphadenopathy.  Reproductive: Prostate gland is normal. There is small amount free fluid in the posterior cul-de-sac likely relates bowel obstruction.  Musculoskeletal: No aggressive osseous lesion.  Other: No evidence of inguinal or abdominal hernia.  IMPRESSION: 1. High-grade mechanical small bowel obstruction without transition point identified. Surgical consultation recommended. 2. No evidence of pneumatosis or portal venous gas to suggest bowel ischemia. 3. Small amount free fluid the pelvis. Findings conveyed  toABIGAIL HARRIS on 07/02/2014  at18:44.   Electronically Signed   By: StSuzy Bouchard.D.   On: 07/02/2014 18:46   Dg Abd Acute W/chest  07/02/2014   CLINICAL DATA:  Left lower quadrant abdominal pain.  EXAM: ACUTE ABDOMEN SERIES (ABDOMEN 2 VIEW & CHEST 1 VIEW)  COMPARISON:  None.  FINDINGS: Chest x-ray reveals no acute cardiopulmonary disease.  Tiny calcified nodule left lung base noted most consistent with granuloma. Multiple dilated loops of bowel are noted. The majority of the dilated bowel appears to be small bowel. The right colon may be dilated. These findings are suggestive of bowel obstruction. Contrast-enhanced CT of the abdomen pelvis should be considered for further evaluation. No free air. No acute bony abnormalities.  IMPRESSION: Findings suggesting bowel obstruction. Contrast-enhanced CT of the abdomen and pelvis should be considered for further evaluation.   Electronically Signed   By: Marcello Moores  Register   On: 07/02/2014 17:30    Review of Systems  Constitutional: Negative for fever and chills.  Respiratory: Negative for shortness of breath.   Cardiovascular: Negative for chest pain.  Gastrointestinal: Positive for nausea, vomiting, abdominal pain and diarrhea. Negative for blood in stool.    Blood pressure 146/79, pulse 69, temperature 98.3 F (36.8 C), temperature source Oral, resp. rate 20, height _0  (1.854 m), weight 210 lb (95.255 kg), SpO2 96 %. Physical Exam  Vitals reviewed. Constitutional: He is oriented to person, place, and time. He appears well-developed and well-nourished.  Eyes: No scleral icterus.  Cardiovascular: Normal rate, regular rhythm and normal heart sounds.   Respiratory: Effort normal and breath sounds normal. He has no wheezes. He has no rales.  GI: He exhibits distension. Bowel sounds are decreased. There is no tenderness. No hernia. Hernia confirmed negative in the ventral area, confirmed negative in the right inguinal area and confirmed negative  in the left inguinal area.  Musculoskeletal: Normal range of motion.  Neurological: He is alert and oriented to person, place, and time.     Assessment/Plan pSBO  He clinically has psbo and radiologically he does appear to have a sbo.  He has no prior surgery. No mass on ct and no hernias on ct or exam.  He does not need urgent operation based on his exam right now.  Literature would support conservative therapy in a virgin abdomen with understanding that if doesn't improve over next 48-72 hours or worsens he will need exploration.  Will place ng tube, iv fluids, repeat films and labs in am. Discussed plan with he and his wife.  Jefferey Lippmann 07/02/2014, 10:54 PM

## 2014-07-02 NOTE — ED Notes (Signed)
Edwin Sparks, [place], made aware of patient's arrival to [facility].

## 2014-07-02 NOTE — ED Notes (Signed)
Pa  at bedside. 

## 2014-07-02 NOTE — ED Provider Notes (Signed)
CSN: [id number]     Arrival date & time 07/02/14  1558 History   First MD Initiated Contact with Patient 07/02/14 1708     Chief Complaint  Patient presents with  . Abdominal Pain     (Consider location/radiation/quality/duration/timing/severity/associated sxs/prior Treatment) Patient is a 38 y.o. male presenting with abdominal pain.  Abdominal Pain Associated symptoms: diarrhea, nausea and vomiting   Associated symptoms: no chest pain, no chills, no dysuria, no fever and no shortness of breath      38 year old male who presents emergency Department with chief complaint of abdominal pain, nausea, vomiting, diarrhea for the past 4 days. Patient states that 4 days ago he began having some abdominal cramping and distention. He felt that he was constipated, gave himself an enema and drink plenty of fluids. We did that night he began vomiting. Patient states that every time he tried to hold down fluids he would vomit. He was able to hold down a few bites of yogurt over the past several days. He has had several episodes of watery diarrhea. He states that he feels like there is a "blockage in his abdomen." He states that every time he drinks fluids he becomes distended and starts vomiting.complains of severe pain in the left upper quadrant. He describes it as constant, worse with movement and walking., Better at rest. He denies contacts with similar sxs, ingestion of suspect foods or water, history of similar sxs, recent foreign travel . Patient denies fevers, chills. He denies a history of abdominal surgeries.  History reviewed. No pertinent past medical history. Past Surgical History  Procedure Laterality Date  . Lasik  2003   Family History  Problem Relation Age of Onset  . Lymphoma Father 1538    NHL; died age 39   History  Substance Use Topics  . Smoking status: Former Smoker    Quit date: 08/22/2001  . Smokeless tobacco: Never Used  . Alcohol Use: Yes     Comment: occ    Review of  Systems  Constitutional: Negative for fever and chills.  Respiratory: Negative for shortness of breath.   Cardiovascular: Negative for chest pain.  Gastrointestinal: Positive for nausea, vomiting, abdominal pain, diarrhea and abdominal distention.  Genitourinary: Negative for dysuria.  Musculoskeletal: Negative for back pain.  Skin: Negative for rash.  Neurological: Positive for light-headedness. Negative for dizziness.  All other systems reviewed and are negative.   Ten systems are reviewed and are negative for acute change except as noted in the HPI   Allergies  Review of patient's allergies indicates no known allergies.  Home Medications   Prior to Admission medications   Not on File   BP 134/97 mmHg  Pulse 93  Temp(Src) 97.8 F (36.6 C) (Oral)  Resp 18  Ht 6\' 1"  (1.854 m)  Wt 210 lb (95.255 kg)  BMI 27.71 kg/m2  SpO2 100% Physical Exam  Constitutional: He appears well-developed and well-nourished. No distress.  HENT:  Head: Normocephalic and atraumatic.  Dry oral mucosa  Eyes: Conjunctivae are normal. No scleral icterus.  Neck: Normal range of motion. Neck supple.  Cardiovascular: Normal rate, regular rhythm and normal heart sounds.   Pulmonary/Chest: Effort normal and breath sounds normal. No respiratory distress.  Abdominal: He exhibits distension. There is no tenderness. There is no rebound.  Tympanic abdomen with distention. Abdomen is quiet with small amount of high-pitched tinkling abdominal noises. He is tender to palpation in the left upper quadrant  Musculoskeletal: He exhibits no edema.  Neurological: He  is alert.  Skin: Skin is warm and dry. He is not diaphoretic.  Psychiatric: His behavior is normal.  Nursing note and vitals reviewed.   ED Course  Procedures (including critical care time) Labs Review Labs Reviewed  URINALYSIS, ROUTINE W REFLEX MICROSCOPIC - Abnormal; Notable for the following:    Color, Urine AMBER (*)    Bilirubin Urine SMALL  (*)    Ketones, ur 15 (*)    Protein, ur 100 (*)    All other components within normal limits  CBC WITH DIFFERENTIAL - Abnormal; Notable for the following:    WBC 10.6 (*)    MCHC 36.5 (*)    Neutro Abs 7.8 (*)    Lymphocytes Relative 9 (*)    Monocytes Relative 17 (*)    Monocytes Absolute 1.8 (*)    All other components within normal limits  COMPREHENSIVE METABOLIC PANEL - Abnormal; Notable for the following:    Sodium 133 (*)    Potassium 3.5 (*)    Chloride 88 (*)    Glucose, Bld 116 (*)    Total Bilirubin 1.5 (*)    GFR calc non Af Amer 83 (*)    All other components within normal limits  LIPASE, BLOOD - Abnormal; Notable for the following:    Lipase 80 (*)    All other components within normal limits  URINE MICROSCOPIC-ADD ON    Imaging Review Dg Abd Acute W/chest  07/02/2014   CLINICAL DATA:  Left lower quadrant abdominal pain.  EXAM: ACUTE ABDOMEN SERIES (ABDOMEN 2 VIEW & CHEST 1 VIEW)  COMPARISON:  None.  FINDINGS: Chest x-ray reveals no acute cardiopulmonary disease. Tiny calcified nodule left lung base noted most consistent with granuloma. Multiple dilated loops of bowel are noted. The majority of the dilated bowel appears to be small bowel. The right colon may be dilated. These findings are suggestive of bowel obstruction. Contrast-enhanced CT of the abdomen pelvis should be considered for further evaluation. No free air. No acute bony abnormalities.  IMPRESSION: Findings suggesting bowel obstruction. Contrast-enhanced CT of the abdomen and pelvis should be considered for further evaluation.   Electronically Signed   By: Maisie Fushomas  Register   On: 07/02/2014 17:30     EKG Interpretation None      MDM   Final diagnoses:  SBO (small bowel obstruction)    Filed Vitals:   07/02/14 1614  BP: 134/97  Pulse: 93  Temp: 97.8 F (36.6 C)  TempSrc: Oral  Resp: 18  Height: 6\' 1"  (1.854 m)  Weight: 210 lb (95.255 kg)  SpO2: 100%    Patient with protein and ketones  in his urine.lipase is elevated at 80 mg/dL. Potassium is slightly low with hypochloremia and slight hypo-neutropenia however electrolyte abnormalities are not abnormal enough to need correction. Does have a slightly elevated glucose, leukocytosis with left shift. An elevated total bili. His acute abdominal series is concerning for possible bowel obstruction.  Ordered a CT scan of the abdomen which is pending. The patient's nausea and pain are well controlled at this time.   6:53 PM BP 134/97 mmHg  Pulse 93  Temp(Src) 97.8 F (36.6 C) (Oral)  Resp 18  Ht 6\' 1"  (1.854 m)  Wt 210 lb (95.255 kg)  BMI 27.71 kg/m2  SpO2 100%  I spoke with Dr. Randa EvensEdwards radiologist to inform me that the patient has a high-grade small bowel obstruction. He suggests a surgical consult. I have informed the patient of the findings and I have called surgery  for consult.  7:05 PM I spoke with Dr. Dwain Sarna at [facility] who has accepted the patient. He asked for an ED ED transfer. I spoke with Dr. Rubin Payor who is aware that the patient is on his way. Dr. Dwain Sarna will be called when he presents to the emergency department.  Arthor Captain, PA-C 07/02/14 1906  Gerhard Munch, MD 07/02/14 [phone]

## 2014-07-02 NOTE — ED Notes (Signed)
Patient transported to X-ray 

## 2014-07-02 NOTE — ED Notes (Signed)
Bed: WA08 Expected date:  Expected time:  Means of arrival:  Comments: [facility] - [facility]

## 2014-07-02 NOTE — ED Notes (Signed)
C/o abd pain, n/v/d x 4 days

## 2014-07-02 NOTE — ED Notes (Signed)
Dr. Wakefield at bedside. 

## 2014-07-03 ENCOUNTER — Inpatient Hospital Stay (HOSPITAL_COMMUNITY): Payer: 59

## 2014-07-03 LAB — CBC
HEMATOCRIT: 38.6 % — AB (ref 39.0–52.0)
Hemoglobin: 13.7 g/dL (ref 13.0–17.0)
MCH: 30.3 pg (ref 26.0–34.0)
MCHC: 35.5 g/dL (ref 30.0–36.0)
MCV: 85.4 fL (ref 78.0–100.0)
Platelets: 163 10*3/uL (ref 150–400)
RBC: 4.52 MIL/uL (ref 4.22–5.81)
RDW: 12.6 % (ref 11.5–15.5)
WBC: 9.1 10*3/uL (ref 4.0–10.5)

## 2014-07-03 LAB — BASIC METABOLIC PANEL
ANION GAP: 18 — AB (ref 5–15)
BUN: 16 mg/dL (ref 6–23)
CO2: 26 mEq/L (ref 19–32)
CREATININE: 1.09 mg/dL (ref 0.50–1.35)
Calcium: 9.2 mg/dL (ref 8.4–10.5)
Chloride: 94 mEq/L — ABNORMAL LOW (ref 96–112)
GFR calc non Af Amer: 84 mL/min — ABNORMAL LOW (ref >90)
Glucose, Bld: 92 mg/dL (ref 70–99)
Potassium: 3.2 mEq/L — ABNORMAL LOW (ref 3.7–5.3)
Sodium: 138 mEq/L (ref 137–147)

## 2014-07-03 LAB — MAGNESIUM: MAGNESIUM: 2.2 mg/dL (ref 1.5–2.5)

## 2014-07-03 MED ORDER — MENTHOL 3 MG MT LOZG
1.0000 | LOZENGE | OROMUCOSAL | Status: DC | PRN
  Filled 2014-07-03: qty 9

## 2014-07-03 MED ORDER — POTASSIUM CHLORIDE IN NACL 40-0.9 MEQ/L-% IV SOLN
INTRAVENOUS | Status: DC
  Administered 2014-07-03 – 2014-07-04 (×4): 100 mL/h via INTRAVENOUS
  Filled 2014-07-03 (×7): qty 1000

## 2014-07-03 MED ORDER — INFLUENZA VAC SPLIT QUAD 0.5 ML IM SUSY: 0.5000 mL | PREFILLED_SYRINGE | INTRAMUSCULAR | Status: DC | PRN

## 2014-07-03 MED ORDER — PHENOL 1.4 % MT LIQD
2.0000 | OROMUCOSAL | Status: DC | PRN
Start: 2014-07-03 — End: 2014-07-05
  Administered 2014-07-03: 2 via OROMUCOSAL
  Filled 2014-07-03: qty 177

## 2014-07-03 MED ORDER — ACETAMINOPHEN 325 MG PO TABS: 650.0000 mg | ORAL_TABLET | Freq: Four times a day (QID) | ORAL | Status: DC | PRN

## 2014-07-03 MED ORDER — PANTOPRAZOLE SODIUM 40 MG IV SOLR
40.0000 mg | Freq: Every day | INTRAVENOUS | Status: DC
  Administered 2014-07-03: 40 mg via INTRAVENOUS
  Filled 2014-07-03 (×2): qty 40

## 2014-07-03 MED ORDER — ENOXAPARIN SODIUM 40 MG/0.4ML ~~LOC~~ SOLN
40.0000 mg | SUBCUTANEOUS | Status: DC
  Administered 2014-07-03 – 2014-07-04 (×2): 40 mg via SUBCUTANEOUS
  Filled 2014-07-03 (×3): qty 0.4

## 2014-07-03 MED ORDER — SODIUM CHLORIDE 0.9 % IV SOLN
INTRAVENOUS | Status: DC
Start: 2014-07-03 — End: 2014-07-03
  Administered 2014-07-03: 02:00:00 via INTRAVENOUS

## 2014-07-03 MED ORDER — ACETAMINOPHEN 650 MG RE SUPP: 650.0000 mg | Freq: Four times a day (QID) | RECTAL | Status: DC | PRN

## 2014-07-03 NOTE — Plan of Care (Signed)
Problem: Phase I Progression Outcomes Goal: Pain controlled with appropriate interventions Outcome: Progressing     

## 2014-07-03 NOTE — ED Notes (Signed)
[place] [id number].

## 2014-07-03 NOTE — Progress Notes (Signed)
Subjective: He doesn't want to talk, some flatus, NG working well.  Objective: Vital signs in last 24 hours: Temp:  [97.8 F (36.6 C)-98.5 F (36.9 C)] 98.2 F (36.8 C) (11/12 0958) Pulse Rate:  [69-93] 75 (11/12 0958) Resp:  [16-20] 20 (11/12 0958) BP: (126-146)/(71-97) 134/71 mmHg (11/12 0958) SpO2:  [96 %-100 %] 99 % (11/12 0958) Weight:  [94.4 kg (208 lb 1.8 oz)-95.255 kg (210 lb)] 94.4 kg (208 lb 1.8 oz) (11/12 0035) Last BM Date: 06/28/14 Afebrile, VSS K+ 3.2 WBC is normal Film shows some SB dilatation and contrast into the colon, he is passing some gas. Intake/Output from previous day:   Intake/Output this shift: Total I/O In: -  Out: 150 [Emesis/NG output:150]  General appearance: alert, cooperative and no distress GI: soft, non-tender; bowel sounds normal; no masses,  no organomegaly  Lab Results:   Recent Labs  07/02/14 1726 07/03/14 0446  WBC 10.6* 9.1  HGB 15.5 13.7  HCT 42.5 38.6*  PLT 174 163    BMET  Recent Labs  07/02/14 1726 07/03/14 0446  NA 133* 138  K 3.5* 3.2*  CL 88* 94*  CO2 31 26  GLUCOSE 116* 92  BUN 17 16  CREATININE 1.10 1.09  CALCIUM 10.1 9.2   PT/INR No results for input(s): LABPROT, INR in the last 72 hours.   Recent Labs Lab 07/02/14 1726  AST 23  ALT 18  ALKPHOS 69  BILITOT 1.5*  PROT 8.1  ALBUMIN 4.2     Lipase     Component Value Date/Time   LIPASE 80* 07/02/2014 1726     Studies/Results: Dg Abd 1 View  07/03/2014   CLINICAL DATA:  Abdominal pain, bowel obstruction  EXAM: ABDOMEN - 1 VIEW  COMPARISON:  07/02/2014  FINDINGS: Gaseous distended small bowel loops in mid abdomen suspicious for ileus or partial bowel obstruction. Contrast material from recent CT scan noted throughout the colon. An NG tube with tip in proximal stomach.  IMPRESSION: Gaseous distended small bowel loops in mid abdomen suspicious for partial small bowel obstruction or ileus. Contrast material from recent CT scan noted  throughout the colon.   Electronically Signed   By: Natasha MeadLiviu  Pop M.D.   On: 07/03/2014 09:46   Ct Abdomen Pelvis W Contrast  07/02/2014   CLINICAL DATA:  Lower abdominal pain, nausea and vomiting. No abdominal surgery.  EXAM: CT ABDOMEN AND PELVIS WITH CONTRAST  TECHNIQUE: Multidetector CT imaging of the abdomen and pelvis was performed using the standard protocol following bolus administration of intravenous contrast.  CONTRAST:  25mL OMNIPAQUE IOHEXOL 300 MG/ML SOLN, 100mL OMNIPAQUE IOHEXOL 300 MG/ML SOLN  COMPARISON:  Radiographs 07/02/2014  FINDINGS: Lower chest:  The lung bases are clear.  No pleural fluid.  Hepatobiliary: No focal hepatic lesion. The gallbladder is normal. No biliary duct dilatation.  Pancreas: Pancreas is normal. No duct dilatation. No pancreatic inflammation.  Spleen: Normal spleen.  Adrenals/urinary tract: Normal adrenal glands. Kidneys, ureters, bladder normal.  Stomach/Bowel: Stomach is normal. The duodenum is normal. The proximal small bowel is dilated up to 4.5 cm. There is poor progression of the ingested oral contrast. The mid small bowel is also dilated and fluid-filled. The distal small bowel leading up to the terminal ileum is collapsed measuring 1 cm. No discrete transition from the dilated proximal small bowel to the collapsed distal small bowel is identified. There is no pneumatosis of the small bowel. No portal venous gas. No intraperitoneal free air.  Colon is collapsed. The appendix  is not clearly identified. Rectum is normal.  Vascular/Lymphatic: Abdominal aorta is normal caliber. There is no retroperitoneal or periportal lymphadenopathy. No pelvic lymphadenopathy.  Reproductive: Prostate gland is normal. There is small amount free fluid in the posterior cul-de-sac likely relates bowel obstruction.  Musculoskeletal: No aggressive osseous lesion.  Other: No evidence of inguinal or abdominal hernia.  IMPRESSION: 1. High-grade mechanical small bowel obstruction without  transition point identified. Surgical consultation recommended. 2. No evidence of pneumatosis or portal venous gas to suggest bowel ischemia. 3. Small amount free fluid the pelvis. Findings conveyed toABIGAIL HARRIS on 07/02/2014  at18:44.   Electronically Signed   By: Genevive BiStewart  Edmunds M.D.   On: 07/02/2014 18:46   Dg Abd Acute W/chest  07/02/2014   CLINICAL DATA:  Left lower quadrant abdominal pain.  EXAM: ACUTE ABDOMEN SERIES (ABDOMEN 2 VIEW & CHEST 1 VIEW)  COMPARISON:  None.  FINDINGS: Chest x-ray reveals no acute cardiopulmonary disease. Tiny calcified nodule left lung base noted most consistent with granuloma. Multiple dilated loops of bowel are noted. The majority of the dilated bowel appears to be small bowel. The right colon may be dilated. These findings are suggestive of bowel obstruction. Contrast-enhanced CT of the abdomen pelvis should be considered for further evaluation. No free air. No acute bony abnormalities.  IMPRESSION: Findings suggesting bowel obstruction. Contrast-enhanced CT of the abdomen and pelvis should be considered for further evaluation.   Electronically Signed   By: Maisie Fushomas  Register   On: 07/02/2014 17:30    Medications: . enoxaparin (LOVENOX) injection  40 mg Subcutaneous Q24H  . pantoprazole (PROTONIX) IV  40 mg Intravenous QHS   . sodium chloride 125 mL/hr at 07/03/14 0133   Prior to Admission medications   Medication Sig Start Date End Date Taking? Authorizing Provider  ibuprofen (ADVIL,MOTRIN) 200 MG tablet Take 400 mg by mouth every 6 (six) hours as needed for moderate pain.   Yes Historical Provider, MD  promethazine (PHENERGAN) 12.5 MG tablet Take 1 tablet by mouth every 4 (four) hours as needed. nausea 06/30/14  Yes Historical Provider, MD  diazepam (VALIUM) 10 MG tablet Take 1 tablet by mouth. 1 hour prior to procedure 06/27/14   Historical Provider, MD  oxyCODONE-acetaminophen (PERCOCET/ROXICET) 5-325 MG per tablet Take 1-2 tablets by mouth every 4 (four)  hours as needed for moderate pain or severe pain.    Historical Provider, MD     Assessment/Plan SBO Hypokalemia  Plan:  I will leave his NG for now and ask him to walk.  Film shows contrast in colon.  No one is sick at home.  Replace K+.     LOS: 1 day    Aroush Chasse 07/03/2014

## 2014-07-03 NOTE — Plan of Care (Signed)
Problem: Consults Goal: General Medical Patient Education See Patient Education Module for specific education. Outcome: Completed/Met Date Met:  07/03/14     

## 2014-07-03 NOTE — Plan of Care (Signed)
Problem: Phase I Progression Outcomes Goal: Voiding-avoid urinary catheter unless indicated Outcome: Completed/Met Date Met:  07/03/14     

## 2014-07-04 ENCOUNTER — Inpatient Hospital Stay (HOSPITAL_COMMUNITY): Payer: 59

## 2014-07-04 LAB — BASIC METABOLIC PANEL
Anion gap: 14 (ref 5–15)
BUN: 16 mg/dL (ref 6–23)
CO2: 24 mEq/L (ref 19–32)
CREATININE: 1.04 mg/dL (ref 0.50–1.35)
Calcium: 8.6 mg/dL (ref 8.4–10.5)
Chloride: 100 mEq/L (ref 96–112)
GFR calc Af Amer: >90 mL/min (ref >90)
GFR, EST NON AFRICAN AMERICAN: 89 mL/min — AB (ref >90)
Glucose, Bld: 91 mg/dL (ref 70–99)
Potassium: 3.9 mEq/L (ref 3.7–5.3)
SODIUM: 138 meq/L (ref 137–147)

## 2014-07-04 LAB — CBC
HEMATOCRIT: 35.3 % — AB (ref 39.0–52.0)
Hemoglobin: 12.1 g/dL — ABNORMAL LOW (ref 13.0–17.0)
MCH: 30.2 pg (ref 26.0–34.0)
MCHC: 34.3 g/dL (ref 30.0–36.0)
MCV: 88 fL (ref 78.0–100.0)
PLATELETS: 175 10*3/uL (ref 150–400)
RBC: 4.01 MIL/uL — AB (ref 4.22–5.81)
RDW: 12.5 % (ref 11.5–15.5)
WBC: 7.8 10*3/uL (ref 4.0–10.5)

## 2014-07-04 LAB — OCCULT BLOOD GASTRIC / DUODENUM (SPECIMEN CUP)
Occult Blood, Gastric: POSITIVE — AB
pH, Gastric: UNDETERMINED

## 2014-07-04 LAB — OCCULT BLOOD X 1 CARD TO LAB, STOOL: FECAL OCCULT BLD: POSITIVE — AB

## 2014-07-04 MED ORDER — PANTOPRAZOLE SODIUM 40 MG IV SOLR
40.0000 mg | Freq: Two times a day (BID) | INTRAVENOUS | Status: DC
  Administered 2014-07-04 – 2014-07-05 (×3): 40 mg via INTRAVENOUS
  Filled 2014-07-04 (×4): qty 40

## 2014-07-04 NOTE — Progress Notes (Signed)
Subjective: He doesn't feel allot better, he is having some diarrhea.  His NG is not doing anything and looks bloody.   NG is not working. Objective: Vital signs in last 24 hours: Temp:  [98.2 F (36.8 C)-98.8 F (37.1 C)] 98.8 F (37.1 C) (11/13 0550) Pulse Rate:  [75-80] 80 (11/13 0550) Resp:  [16-20] 16 (11/13 0550) BP: (132-136)/(71-80) 136/73 mmHg (11/13 0550) SpO2:  [98 %-99 %] 98 % (11/13 0550) Last BM Date: 07/03/14 (mostly liquid only) 1 BM recorded yesterday 275 from the NG Afebrile, VSS K+ up to 3.9 Film for today is pending Intake/Output from previous day: 11/12 0701 - 11/13 0700 In: 0  Out: 275 [Emesis/NG output:275] Intake/Output this shift: Total I/O In: 2016.7 [I.V.:2016.7] Out: 150 [Emesis/NG output:150]  General appearance: alert, cooperative and no distress Resp: clear to auscultation bilaterally GI: soft, BS hyperactive, abdomen isn't really distended, a little tender.   Lab Results:   Recent Labs  07/02/14 1726 07/03/14 0446  WBC 10.6* 9.1  HGB 15.5 13.7  HCT 42.5 38.6*  PLT 174 163    BMET  Recent Labs  07/03/14 0446 07/04/14 0435  NA 138 138  K 3.2* 3.9  CL 94* 100  CO2 26 24  GLUCOSE 92 91  BUN 16 16  CREATININE 1.09 1.04  CALCIUM 9.2 8.6   PT/INR No results for input(s): LABPROT, INR in the last 72 hours.   Recent Labs Lab 07/02/14 1726  AST 23  ALT 18  ALKPHOS 69  BILITOT 1.5*  PROT 8.1  ALBUMIN 4.2     Lipase     Component Value Date/Time   LIPASE 80* 07/02/2014 1726     Studies/Results: Dg Abd 1 View  07/03/2014   CLINICAL DATA:  Abdominal pain, bowel obstruction  EXAM: ABDOMEN - 1 VIEW  COMPARISON:  07/02/2014  FINDINGS: Gaseous distended small bowel loops in mid abdomen suspicious for ileus or partial bowel obstruction. Contrast material from recent CT scan noted throughout the colon. An NG tube with tip in proximal stomach.  IMPRESSION: Gaseous distended small bowel loops in mid abdomen suspicious  for partial small bowel obstruction or ileus. Contrast material from recent CT scan noted throughout the colon.   Electronically Signed   By: Natasha MeadLiviu  Pop M.D.   On: 07/03/2014 09:46   Ct Abdomen Pelvis W Contrast  07/02/2014   CLINICAL DATA:  Lower abdominal pain, nausea and vomiting. No abdominal surgery.  EXAM: CT ABDOMEN AND PELVIS WITH CONTRAST  TECHNIQUE: Multidetector CT imaging of the abdomen and pelvis was performed using the standard protocol following bolus administration of intravenous contrast.  CONTRAST:  25mL OMNIPAQUE IOHEXOL 300 MG/ML SOLN, 100mL OMNIPAQUE IOHEXOL 300 MG/ML SOLN  COMPARISON:  Radiographs 07/02/2014  FINDINGS: Lower chest:  The lung bases are clear.  No pleural fluid.  Hepatobiliary: No focal hepatic lesion. The gallbladder is normal. No biliary duct dilatation.  Pancreas: Pancreas is normal. No duct dilatation. No pancreatic inflammation.  Spleen: Normal spleen.  Adrenals/urinary tract: Normal adrenal glands. Kidneys, ureters, bladder normal.  Stomach/Bowel: Stomach is normal. The duodenum is normal. The proximal small bowel is dilated up to 4.5 cm. There is poor progression of the ingested oral contrast. The mid small bowel is also dilated and fluid-filled. The distal small bowel leading up to the terminal ileum is collapsed measuring 1 cm. No discrete transition from the dilated proximal small bowel to the collapsed distal small bowel is identified. There is no pneumatosis of the small bowel. No portal  venous gas. No intraperitoneal free air.  Colon is collapsed. The appendix is not clearly identified. Rectum is normal.  Vascular/Lymphatic: Abdominal aorta is normal caliber. There is no retroperitoneal or periportal lymphadenopathy. No pelvic lymphadenopathy.  Reproductive: Prostate gland is normal. There is small amount free fluid in the posterior cul-de-sac likely relates bowel obstruction.  Musculoskeletal: No aggressive osseous lesion.  Other: No evidence of inguinal or  abdominal hernia.  IMPRESSION: 1. High-grade mechanical small bowel obstruction without transition point identified. Surgical consultation recommended. 2. No evidence of pneumatosis or portal venous gas to suggest bowel ischemia. 3. Small amount free fluid the pelvis. Findings conveyed toABIGAIL HARRIS on 07/02/2014  at18:44.   Electronically Signed   By: Genevive BiStewart  Edmunds M.D.   On: 07/02/2014 18:46   Dg Abd Acute W/chest  07/02/2014   CLINICAL DATA:  Left lower quadrant abdominal pain.  EXAM: ACUTE ABDOMEN SERIES (ABDOMEN 2 VIEW & CHEST 1 VIEW)  COMPARISON:  None.  FINDINGS: Chest x-ray reveals no acute cardiopulmonary disease. Tiny calcified nodule left lung base noted most consistent with granuloma. Multiple dilated loops of bowel are noted. The majority of the dilated bowel appears to be small bowel. The right colon may be dilated. These findings are suggestive of bowel obstruction. Contrast-enhanced CT of the abdomen pelvis should be considered for further evaluation. No free air. No acute bony abnormalities.  IMPRESSION: Findings suggesting bowel obstruction. Contrast-enhanced CT of the abdomen and pelvis should be considered for further evaluation.   Electronically Signed   By: Maisie Fushomas  Register   On: 07/02/2014 17:30    Medications: . enoxaparin (LOVENOX) injection  40 mg Subcutaneous Q24H  . pantoprazole (PROTONIX) IV  40 mg Intravenous QHS    Assessment/Plan SBO Hypokalemia   Plan:  Drainage from the NG looks bloody, NG isn't working, he doesn't feel that much better.  I have irrigated the NG and it's not coming back.  He is going down now for repeat abdominal films.  No history of ulcers.  Film looks better.  We are going to clamp his NG and give him some clears, see how he does.  I will recheck cbc, I think this is just tube irritation causing blood in the NG.  No hx of bloody stools.    LOS: 2 days    Kathleene Bergemann 07/04/2014

## 2014-07-05 LAB — BASIC METABOLIC PANEL
Anion gap: 13 (ref 5–15)
BUN: 8 mg/dL (ref 6–23)
CO2: 23 mEq/L (ref 19–32)
CREATININE: 0.92 mg/dL (ref 0.50–1.35)
Calcium: 8.6 mg/dL (ref 8.4–10.5)
Chloride: 100 mEq/L (ref 96–112)
GFR calc Af Amer: >90 mL/min (ref >90)
GLUCOSE: 102 mg/dL — AB (ref 70–99)
POTASSIUM: 4 meq/L (ref 3.7–5.3)
Sodium: 136 mEq/L — ABNORMAL LOW (ref 137–147)

## 2014-07-05 LAB — CBC
HEMATOCRIT: 33.2 % — AB (ref 39.0–52.0)
Hemoglobin: 11.6 g/dL — ABNORMAL LOW (ref 13.0–17.0)
MCH: 30.3 pg (ref 26.0–34.0)
MCHC: 34.9 g/dL (ref 30.0–36.0)
MCV: 86.7 fL (ref 78.0–100.0)
Platelets: 200 10*3/uL (ref 150–400)
RBC: 3.83 MIL/uL — ABNORMAL LOW (ref 4.22–5.81)
RDW: 12.4 % (ref 11.5–15.5)
WBC: 8.7 10*3/uL (ref 4.0–10.5)

## 2014-07-05 MED ORDER — INFLUENZA VAC SPLIT QUAD 0.5 ML IM SUSY
0.5000 mL | PREFILLED_SYRINGE | Freq: Once | INTRAMUSCULAR | Status: AC
  Administered 2014-07-05: 0.5 mL via INTRAMUSCULAR
  Filled 2014-07-05: qty 0.5

## 2014-07-05 NOTE — Progress Notes (Signed)
  Subjective: Passing flatus, a lot of loose stool, tol clears  Objective: Vital signs in last 24 hours: Temp:  [98 F (36.7 C)-98.1 F (36.7 C)] 98.1 F (36.7 C) (11/14 0500) Pulse Rate:  [81-85] 81 (11/14 0500) Resp:  [16-18] 18 (11/14 0500) BP: (129-137)/(76-78) 129/78 mmHg (11/14 0500) SpO2:  [98 %-100 %] 98 % (11/14 0500) Last BM Date: 07/04/14  Intake/Output from previous day: 11/13 0701 - 11/14 0700 In: 2496.7 [P.O.:480; I.V.:2016.7] Out: 255 [Urine:3; Emesis/NG output:250; Stool:2] Intake/Output this shift:    GI: soft nt  Lab Results:   Recent Labs  07/04/14 0435 07/05/14 0507  WBC 7.8 8.7  HGB 12.1* 11.6*  HCT 35.3* 33.2*  PLT 175 200   BMET  Recent Labs  07/03/14 0446 07/04/14 0435  NA 138 138  K 3.2* 3.9  CL 94* 100  CO2 26 24  GLUCOSE 92 91  BUN 16 16  CREATININE 1.09 1.04  CALCIUM 9.2 8.6   PT/INR No results for input(s): LABPROT, INR in the last 72 hours. ABG No results for input(s): PHART, HCO3 in the last 72 hours.  Invalid input(s): PCO2, PO2  Studies/Results: Dg Abd 1 View  07/04/2014   CLINICAL DATA:  Follow-up of small bowel obstruction ; clinically improved today  EXAM: ABDOMEN - 1 VIEW  COMPARISON:  Abdominal series of July 03, 2014  FINDINGS: There remain loops of mildly distended gas-filled small bowel in the mid abdomen. There is a moderate volume of gas within the stomach. The proximal port of the nasogastric tube lies at or above the GE junction. The orally administered contrast from the earlier CT scan has migrated further through the colon though some remains. No free extraluminal gas collections are demonstrated.  IMPRESSION: There is mild improvement in the appearance of the distended small bowel loops. Advancement of the nasogastric tube by approximately 10 cm is recommended to assure that the proximal port lies below the expected level of the GE junction.   Electronically Signed   By: David  Swazil[place]Jordan   On: 07/04/2014  10:53   Dg Abd 1 View  07/03/2014   CLINICAL DATA:  Abdominal pain, bowel obstruction  EXAM: ABDOMEN - 1 VIEW  COMPARISON:  07/02/2014  FINDINGS: Gaseous distended small bowel loops in mid abdomen suspicious for ileus or partial bowel obstruction. Contrast material from recent CT scan noted throughout the colon. An NG tube with tip in proximal stomach.  IMPRESSION: Gaseous distended small bowel loops in mid abdomen suspicious for partial small bowel obstruction or ileus. Contrast material from recent CT scan noted throughout the colon.   Electronically Signed   By: Natasha MeadLiviu  Pop M.D.   On: 07/03/2014 09:46    Anti-infectives: Anti-infectives    None      Assessment/Plan: sbo vs ileus  I removed ng, has bowel function, nl exam and contrast in colon.  Im not sure of etiology but he has no evidence of a tumor/volvulus or hernia on exam or ct scan.  I dont think any further evaluation is needed unless has recurrence. I discussed this with him.  Will  Give fulls and send home if tolerates. Discussed staying on liquids for a couple days then slowly advancing diet  [facility] LLCWAKEFIELD,Princessa Lesmeister 07/05/2014

## 2014-07-05 NOTE — Discharge Instructions (Signed)
Small Bowel Obstruction °A small bowel obstruction is a blockage (obstruction) of the small intestine (small bowel). The small bowel is a long, slender tube that connects the stomach to the colon. Its job is to absorb nutrients from the fluids and foods you consume into the bloodstream.  °CAUSES  °There are many causes of intestinal blockage. The most common ones include: °· Hernias. This is a more common cause in children than adults. °· Inflammatory bowel disease (enteritis and colitis). °· Twisting of the bowel (volvulus). °· Tumors. °· Scar tissue (adhesions) from previous surgery or radiation treatment. °· Recent surgery. This may cause an acute small bowel obstruction called an ileus. °SYMPTOMS  °· Abdominal pain. This may be dull cramps or sharp pain. It may occur in one area or may be present in the entire abdomen. Pain can range from mild to severe, depending on the degree of obstruction. °· Nausea and vomiting. Vomit may be greenish or yellow bile color. °· Distended or swollen stomach. Abdominal bloating is a common symptom. °· Constipation. °· Lack of passing gas. °· Frequent belching. °· Diarrhea. This may occur if runny stool is able to leak around the obstruction. °DIAGNOSIS  °Your caregiver can usually diagnose small bowel obstruction by taking a history, doing a physical exam, and taking X-rays. If the cause is unclear, a CT scan (computerized tomography) of your abdomen and pelvis may be needed. °TREATMENT  °Treatment of the blockage depends on the cause and how bad the problem is.  °· Sometimes, the obstruction improves with bed rest and intravenous (IV) fluids. °· Resting the bowel is very important. This means following a simple diet. Sometimes, a clear liquid diet may be required for several days. °· Sometimes, a small tube (nasogastric tube) is placed into the stomach to decompress the bowel. When the bowel is blocked, it usually swells up like a balloon filled with air and fluids.  Decompression means that the air and fluids are removed by suction through that tube. This can help with pain, discomfort, and nausea. It can also help the obstruction resolve faster. °· Surgery may be required if other treatments do not work. Bowel obstruction from a hernia may require early surgery and can be an emergency procedure. Adhesions that cause frequent or severe obstructions may also require surgery. °HOME CARE INSTRUCTIONS °If your bowel obstruction is only partial or incomplete, you may be allowed to go home. °· Get plenty of rest. °· Follow your diet as directed by your caregiver. °· Only consume clear liquids until your condition improves. °· Avoid solid foods as instructed. °SEEK IMMEDIATE MEDICAL CARE IF: °· You have increased pain or cramping. °· You vomit blood. °· You have uncontrolled vomiting or nausea. °· You cannot drink fluids due to vomiting or pain. °· You develop confusion. °· You begin feeling very dry or thirsty (dehydrated). °· You have severe bloating. °· You have chills. °· You have a fever. °· You feel extremely weak or you faint. °MAKE SURE YOU: °· Understand these instructions. °· Will watch your condition. °· Will get help right away if you are not doing well or get worse. °Document Released: 10/25/2005 Document Revised: 10/31/2011 Document Reviewed: 10/22/2010 °[employer]® Patient Information ©2015 [employer]. This information is not intended to replace advice given to you by your health care provider. Make sure you discuss any questions you have with your health care provider. ° °

## 2014-07-05 NOTE — Progress Notes (Signed)
Pt tolerated full liquid diet for breakfast and lunch. No c/o abd pain or nausea.

## 2014-07-09 NOTE — Discharge Summary (Signed)
Physician Discharge Summary  Patient ID: Edwin DowningBenjamin A Sparks MRN: [id number] DOB/AGE: [birth year] 39 y.o.  Admit date: 07/02/2014 Discharge date: 07/09/2014  Admission Diagnoses: Ileus vs bowel obstruction Discharge Diagnoses:  Active Problems:   Bowel obstruction   Discharged Condition: good  Hospital Course: 38yom who drank etoh and ate a lot of potato chips prior to admission and then presented with abdominal distention and possible bowel obstruction. He had no prior surgery, ct showed no masses and he had no hernias. He had ng placement and resolved this very quickly.  His diet was advanced and his bowels were functioning. We discussed further evaluation but i think with history, quick resolution and no source found it is fine to watch him clinically.  Consults: None  Significant Diagnostic Studies: none  Treatments: IV hydration    Disposition: 01-Home or Self Care     Medication List    TAKE these medications        diazepam 10 MG tablet  Commonly known as:  VALIUM  Take 1 tablet by mouth. 1 hour prior to procedure     ibuprofen 200 MG tablet  Commonly known as:  ADVIL,MOTRIN  Take 400 mg by mouth every 6 (six) hours as needed for moderate pain.     oxyCODONE-acetaminophen 5-325 MG per tablet  Commonly known as:  PERCOCET/ROXICET  Take 1-2 tablets by mouth every 4 (four) hours as needed for moderate pain or severe pain.     promethazine 12.5 MG tablet  Commonly known as:  PHENERGAN  Take 1 tablet by mouth every 4 (four) hours as needed. nausea           Follow-up Information    Follow up with Emelia LoronWAKEFIELD,Nicha Hemann, MD.   Specialty:  General Surgery   Why:  As needed   Contact information:   [address] [place] [address] [phone]       Signed: Emelia LoronWAKEFIELD,Armend Hochstatter 07/09/2014, 9:11 AM

## 2015-01-28 ENCOUNTER — Telehealth: Payer: Self-pay | Admitting: Family Medicine

## 2015-01-28 NOTE — Telephone Encounter (Signed)
Pt was bite 2 weeks ago in the arm pit area. States bite site is healing and going away but Monday he started getting extremely tired and has had a slight headache. He is concerned if the bite may have something to do with the tiredness and headache. He is going to beach next week so if any labs are needed he will need to come in this week. Please advise pt. Of Dr. Samul DadaMcGowen's thoughts.

## 2015-01-30 NOTE — Telephone Encounter (Signed)
Labs not needed because not helpful in this type of situation. Has he been having any fevers or pain in the back of his neck?

## 2015-01-30 NOTE — Telephone Encounter (Signed)
Pt advised and voiced understanding. He stated that he did not have any fever, neck or back pain. He stated that he is feeling much better today.

## 2015-01-30 NOTE — Telephone Encounter (Signed)
Left message to call back  

## 2015-05-06 ENCOUNTER — Telehealth: Payer: Self-pay | Admitting: Family Medicine

## 2015-05-06 MED ORDER — NEOMYCIN-POLYMYXIN-HC 3.5-10000-1 OT SOLN: OTIC | Status: DC

## 2015-05-06 NOTE — Telephone Encounter (Signed)
Left detailed message on cell vm, okay per DPR.  

## 2015-05-06 NOTE — Telephone Encounter (Signed)
Please advise. Thanks.  

## 2015-05-06 NOTE — Telephone Encounter (Signed)
Cortisporin otic drops eRxd today. No o/v needed unless not improving in 2-3 days.-thx

## 2015-05-06 NOTE — Telephone Encounter (Signed)
Pt has ear pain the same he did last year. He was wondering if Dr. Milinda Cave would call in some of the same ear drops because they worked, or does he need to be seen. I explained we would call with an answer.

## 2015-06-24 ENCOUNTER — Encounter: Payer: Self-pay | Admitting: Family Medicine

## 2015-07-01 ENCOUNTER — Encounter: Payer: Self-pay | Admitting: Family Medicine

## 2015-07-01 ENCOUNTER — Ambulatory Visit (INDEPENDENT_AMBULATORY_CARE_PROVIDER_SITE_OTHER): Payer: Commercial Managed Care - HMO | Admitting: Family Medicine

## 2015-07-01 VITALS — BP 125/87 | HR 64 | Temp 97.9°F | Resp 16 | Ht 72.75 in | Wt 210.0 lb

## 2015-07-01 DIAGNOSIS — Z23 Encounter for immunization: Secondary | ICD-10-CM

## 2015-07-01 DIAGNOSIS — Z125 Encounter for screening for malignant neoplasm of prostate: Secondary | ICD-10-CM

## 2015-07-01 DIAGNOSIS — Z0000 Encounter for general adult medical examination without abnormal findings: Secondary | ICD-10-CM

## 2015-07-01 LAB — COMPREHENSIVE METABOLIC PANEL
ALBUMIN: 4.9 g/dL (ref 3.5–5.2)
ALT: 17 U/L (ref 0–53)
AST: 17 U/L (ref 0–37)
Alkaline Phosphatase: 62 U/L (ref 39–117)
BILIRUBIN TOTAL: 0.6 mg/dL (ref 0.2–1.2)
BUN: 11 mg/dL (ref 6–23)
CALCIUM: 9.7 mg/dL (ref 8.4–10.5)
CO2: 29 meq/L (ref 19–32)
CREATININE: 1.05 mg/dL (ref 0.40–1.50)
Chloride: 104 mEq/L (ref 96–112)
GFR: 82.95 mL/min (ref >60.00)
Glucose, Bld: 94 mg/dL (ref 70–99)
Potassium: 4.3 mEq/L (ref 3.5–5.1)
Sodium: 141 mEq/L (ref 135–145)
TOTAL PROTEIN: 7.3 g/dL (ref 6.0–8.3)

## 2015-07-01 LAB — CBC WITH DIFFERENTIAL/PLATELET
BASOS ABS: 0 10*3/uL (ref 0.0–0.1)
Basophils Relative: 0.4 % (ref 0.0–3.0)
EOS PCT: 1.7 % (ref 0.0–5.0)
Eosinophils Absolute: 0.1 10*3/uL (ref 0.0–0.7)
HEMATOCRIT: 45.8 % (ref 39.0–52.0)
HEMOGLOBIN: 15.4 g/dL (ref 13.0–17.0)
LYMPHS ABS: 1.4 10*3/uL (ref 0.7–4.0)
LYMPHS PCT: 26.7 % (ref 12.0–46.0)
MCHC: 33.6 g/dL (ref 30.0–36.0)
MCV: 89.4 fl (ref 78.0–100.0)
MONOS PCT: 8.7 % (ref 3.0–12.0)
Monocytes Absolute: 0.5 10*3/uL (ref 0.1–1.0)
Neutro Abs: 3.4 10*3/uL (ref 1.4–7.7)
Neutrophils Relative %: 62.5 % (ref 43.0–77.0)
Platelets: 203 10*3/uL (ref 150.0–400.0)
RBC: 5.12 Mil/uL (ref 4.22–5.81)
RDW: 13 % (ref 11.5–15.5)
WBC: 5.4 10*3/uL (ref 4.0–10.5)

## 2015-07-01 LAB — LIPID PANEL
CHOL/HDL RATIO: 4
Cholesterol: 149 mg/dL (ref 0–200)
HDL: 38.5 mg/dL — ABNORMAL LOW (ref >39.00)
LDL Cholesterol: 95 mg/dL (ref 0–99)
NONHDL: 110.27
TRIGLYCERIDES: 75 mg/dL (ref 0.0–149.0)
VLDL: 15 mg/dL (ref 0.0–40.0)

## 2015-07-01 LAB — TSH: TSH: 1.65 u[IU]/mL (ref 0.35–4.50)

## 2015-07-01 NOTE — Progress Notes (Signed)
Office Note 07/01/2015  CC:  Chief Complaint  Patient presents with  . Annual Exam    Pt is fasting.     HPI:  Edwin Sparks is a 39 y.o. White male who is here for annual health maintenance exam.   Not exercising as much, busy with 3 kids and with work. Jogs 2-3 miles a couple times a week.  Trying to use fit-bit more lately.  Dental preventatives UTD.     Past Medical History  Diagnosis Date  . Small bowel obstruction (HCC) 06/2014    No surgery necessary  . H/O vasectomy 2015    Past Surgical History  Procedure Laterality Date  . Lasik  2003    Family History  Problem Relation Age of Onset  . Lymphoma Father 4    NHL; died age 85    Social History   Social History  . Marital Status: Married    Spouse Name: N/A  . Number of Children: N/A  . Years of Education: N/A   Occupational History  . Not on file.   Social History Main Topics  . Smoking status: Former Smoker    Quit date: 08/22/2001  . Smokeless tobacco: Never Used  . Alcohol Use: Yes     Comment: occ  . Drug Use: No  . Sexual Activity: Not on file   Other Topics Concern  . Not on file   Social History Narrative   Married, has one son and one daughter.   Relocated to [facility] 2006.     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   Lives in [place].     No Tob, 1 glass of wine with dinner.  No drugs.   Exercises 4 d/week--cardio and wt lifting.    Outpatient Prescriptions Prior to Visit  Medication Sig Dispense Refill  . diazepam (VALIUM) 10 MG tablet Take 1 tablet by mouth. 1 hour prior to procedure  0  . ibuprofen (ADVIL,MOTRIN) 200 MG tablet Take 400 mg by mouth every 6 (six) hours as needed for moderate pain.    Marland Kitchen neomycin-polymyxin-hydrocortisone (CORTISPORIN) otic solution 3 drops in affected ear 4 times a day x 7d (Patient not taking: Reported on 07/01/2015) 10 mL 0  . oxyCODONE-acetaminophen (PERCOCET/ROXICET) 5-325 MG per tablet Take 1-2 tablets by mouth  every 4 (four) hours as needed for moderate pain or severe pain.    . promethazine (PHENERGAN) 12.5 MG tablet Take 1 tablet by mouth every 4 (four) hours as needed. nausea     No facility-administered medications prior to visit.    No Known Allergies  ROS Review of Systems  Constitutional: Negative for fever, chills, appetite change and fatigue.  HENT: Negative for congestion, dental problem, ear pain and sore throat.   Eyes: Negative for discharge, redness and visual disturbance.  Respiratory: Negative for cough, chest tightness, shortness of breath and wheezing.   Cardiovascular: Negative for chest pain, palpitations and leg swelling.  Gastrointestinal: Negative for nausea, vomiting, abdominal pain, diarrhea and blood in stool.  Genitourinary: Negative for dysuria, urgency, frequency, hematuria, flank pain and difficulty urinating.  Musculoskeletal: Negative for myalgias, back pain, joint swelling, arthralgias and neck stiffness.  Skin: Negative for pallor and rash.  Neurological: Negative for dizziness, speech difficulty, weakness and headaches.  Hematological: Negative for adenopathy. Does not bruise/bleed easily.  Psychiatric/Behavioral: Negative for confusion and sleep disturbance. The patient is not nervous/anxious.     PE; Blood pressure 125/87, pulse 64, temperature 97.9 F (36.6 C), temperature  source Oral, resp. rate 16, height 6' 0.75" (1.848 m), weight 210 lb (95.255 kg), SpO2 99 %. Gen: Alert, well appearing.  Patient is oriented to person, place, time, and situation. AFFECT: pleasant, lucid thought and speech. ENT: Ears: EACs clear, normal epithelium.  TMs with good light reflex and landmarks bilaterally.  Eyes: no injection, icteris, swelling, or exudate.  EOMI, PERRLA. Nose: no drainage or turbinate edema/swelling.  No injection or focal lesion.  Mouth: lips without lesion/swelling.  Oral mucosa pink and moist.  Dentition intact and without obvious caries or gingival  swelling.  Oropharynx without erythema, exudate, or swelling.  Neck: supple/nontender.  No LAD, mass, or TM.  Carotid pulses 2+ bilaterally, without bruits. CV: RRR, no m/r/g.   LUNGS: CTA bilat, nonlabored resps, good aeration in all lung fields. ABD: soft, NT, ND, BS normal.  No hepatospenomegaly or mass.  No bruits. EXT: no clubbing, cyanosis, or edema.  Musculoskeletal: no joint swelling, erythema, warmth, or tenderness.  ROM of all joints intact. Skin - no sores or suspicious lesions or rashes or color changes  Pertinent labs:  Lab Results  Component Value Date   TSH 2.55 02/20/2014   Lab Results  Component Value Date   WBC 8.7 07/05/2014   HGB 11.6* 07/05/2014   HCT 33.2* 07/05/2014   MCV 86.7 07/05/2014   PLT 200 07/05/2014   Lab Results  Component Value Date   CREATININE 0.92 07/05/2014   BUN 8 07/05/2014   NA 136* 07/05/2014   K 4.0 07/05/2014   CL 100 07/05/2014   CO2 23 07/05/2014   Lab Results  Component Value Date   ALT 18 07/02/2014   AST 23 07/02/2014   ALKPHOS 69 07/02/2014   BILITOT 1.5* 07/02/2014   Lab Results  Component Value Date   CHOL 216* 02/20/2014   Lab Results  Component Value Date   HDL 44.10 02/20/2014   Lab Results  Component Value Date   LDLCALC 151* 02/20/2014   Lab Results  Component Value Date   TRIG 105.0 02/20/2014   Lab Results  Component Value Date   CHOLHDL 5 02/20/2014   ASSESSMENT AND PLAN:   Health maintenance exam: Reviewed age and gender appropriate health maintenance issues (prudent diet, regular exercise, health risks of tobacco and excessive alcohol, use of seatbelts, fire alarms in home, use of sunscreen).  Also reviewed age and gender appropriate health screening as well as vaccine recommendations. Flu vaccine today. HP labs (fasting) drawn today.  An After Visit Summary was printed and given to the patient.  FOLLOW UP:  Return in about 1 year (around 06/30/2016) for annual CPE (fasting).

## 2015-07-01 NOTE — Progress Notes (Signed)
Pre visit review using our clinic review tool, if applicable. No additional management support is needed unless otherwise documented below in the visit note. 

## 2015-08-14 ENCOUNTER — Ambulatory Visit (INDEPENDENT_AMBULATORY_CARE_PROVIDER_SITE_OTHER): Payer: Commercial Managed Care - HMO | Admitting: Family Medicine

## 2015-08-14 ENCOUNTER — Encounter: Payer: Self-pay | Admitting: Family Medicine

## 2015-08-14 VITALS — BP 139/87 | HR 92 | Temp 98.0°F | Resp 20 | Wt 214.0 lb

## 2015-08-14 DIAGNOSIS — J069 Acute upper respiratory infection, unspecified: Secondary | ICD-10-CM | POA: Diagnosis not present

## 2015-08-14 DIAGNOSIS — B309 Viral conjunctivitis, unspecified: Secondary | ICD-10-CM | POA: Diagnosis not present

## 2015-08-14 DIAGNOSIS — J209 Acute bronchitis, unspecified: Secondary | ICD-10-CM

## 2015-08-14 MED ORDER — METHYLPREDNISOLONE ACETATE 80 MG/ML IJ SUSP
80.0000 mg | Freq: Once | INTRAMUSCULAR | Status: AC
  Administered 2015-08-14: 80 mg via INTRAMUSCULAR

## 2015-08-14 MED ORDER — HYDROCODONE-HOMATROPINE 5-1.5 MG/5ML PO SYRP: ORAL_SOLUTION | ORAL | Status: DC

## 2015-08-14 NOTE — Patient Instructions (Signed)
Get otc generic robitussin DM OR Mucinex DM and use as directed on the packaging for cough and congestion. Use otc generic saline nasal spray 2-3 times per day to irrigate/moisturize your nasal passages.   

## 2015-08-14 NOTE — Progress Notes (Signed)
OFFICE VISIT  08/14/2015   CC:  Chief Complaint  Patient presents with  . URI  . Conjunctivitis    right eye   HPI:    Patient is a 39 y.o. Caucasian male who presents for respiratory complaints. He is not a smoker.  He got his flu vaccine 07/01/15.  Onset 5 d/a, nasal congestion/runny nose, cough, cough started getting worse hs.  Subjective fever at onset but none in last few days.  Mild ST.  Initially mild body aches but none now.  No wheezing or SOB. His kids have recently had conjunctivitis, were rx'd drops by their pediatrician.  Pt started getting red/crusty R eye today.  No pain in the eye.  No vision deficit. He has been taking benadryl hs and some of his wife's hycodan hs. Overall since start he is improved but last few days improvement has stagnated: no worsening.   Past Medical History  Diagnosis Date  . Small bowel obstruction (HCC) 06/2014    No surgery necessary  . H/O vasectomy 2015    Past Surgical History  Pro[facility] Date  [facility]    Outpatient Prescriptions Prior to Visit  Medication Sig Dispense Refill  . Omega-3 Fatty Acids (FISH OIL BURP-LESS PO) Take 1 capsule by mouth daily.     No facility-administered medications prior to visit.    No Known Allergies  ROS As per HPI  PE: Blood pressure 139/87, pulse 92, temperature 98 F (36.7 C), resp. rate 20, weight 214 lb (97.07 kg), SpO2 96 %. VS: noted--normal Gen: alert, NAD, NONTOXIC APPEARING. HEENT: eyes: L eye without injection, drainage, or swelling.  R eye with minimal injection of the bulbar conjunctiva nasally.  No drainage, no swelling.  EOMI, PERRL.  Ears: EACs clear, TMs with normal light reflex and landmarks.  Nose: Scant clear rhinorrhea, with some dried, crusty exudate adherent to mildly injected mucosa.  No purulent d/c.  No paranasal sinus TTP.  No facial swelling.  Throat and mouth without focal lesion.  No pharyngial swelling, erythema, or exudate.   Neck: supple, no  LAD.   LUNGS: CTA bilat, nonlabored resps.  He has slight increased tendency to cough with exhalation. CV: RRR, no m/r/g. EXT: no c/c/e SKIN: no rash  LABS:  none  IMPRESSION AND PLAN:  Viral URI, acute bronchitis--mild RAD component.  R eye viral conjunctivitis. Depo-medrol 80mg  IM in office today. Hycodan syrup 1-2 tsp qhs prn, #120 ml. I recommended he not use his child's antibacterial eye drops for his eye since I feel this is a viral process. Get otc generic robitussin DM OR Mucinex DM and use as directed on the packaging for cough and congestion. Use otc generic saline nasal spray 2-3 times per day to irrigate/moisturize your nasal passages.  An After Visit Summary was printed and given to the patient.  FOLLOW UP: Return if symptoms worsen or fail to improve.

## 2016-02-08 ENCOUNTER — Other Ambulatory Visit: Payer: Managed Care, Other (non HMO)

## 2016-02-08 ENCOUNTER — Ambulatory Visit (HOSPITAL_BASED_OUTPATIENT_CLINIC_OR_DEPARTMENT_OTHER)
Admission: RE | Admit: 2016-02-08 | Discharge: 2016-02-08 | Disposition: A | Discharge status: Home / Self Care | Payer: Managed Care, Other (non HMO) | Source: Ambulatory Visit | Attending: Family Medicine | Admitting: Family Medicine

## 2016-02-08 ENCOUNTER — Encounter: Payer: Self-pay | Admitting: Family Medicine

## 2016-02-08 ENCOUNTER — Ambulatory Visit (INDEPENDENT_AMBULATORY_CARE_PROVIDER_SITE_OTHER): Payer: Managed Care, Other (non HMO) | Admitting: Family Medicine

## 2016-02-08 VITALS — BP 134/87 | HR 69 | Temp 97.9°F | Resp 16 | Ht 72.75 in | Wt 208.8 lb

## 2016-02-08 DIAGNOSIS — K529 Noninfective gastroenteritis and colitis, unspecified: Secondary | ICD-10-CM

## 2016-02-08 DIAGNOSIS — R1013 Epigastric pain: Secondary | ICD-10-CM | POA: Diagnosis not present

## 2016-02-08 DIAGNOSIS — Z8719 Personal history of other diseases of the digestive system: Secondary | ICD-10-CM | POA: Diagnosis not present

## 2016-02-08 LAB — COMPREHENSIVE METABOLIC PANEL
ALK PHOS: 60 U/L (ref 40–115)
ALT: 14 U/L (ref 9–46)
AST: 17 U/L (ref 10–40)
Albumin: 4.9 g/dL (ref 3.6–5.1)
BILIRUBIN TOTAL: 1 mg/dL (ref 0.2–1.2)
BUN: 13 mg/dL (ref 7–25)
CO2: 27 mmol/L (ref 20–31)
Calcium: 9.7 mg/dL (ref 8.6–10.3)
Chloride: 100 mmol/L (ref 98–110)
Creat: 1.02 mg/dL (ref 0.60–1.35)
GLUCOSE: 82 mg/dL (ref 65–99)
POTASSIUM: 4.3 mmol/L (ref 3.5–5.3)
Sodium: 139 mmol/L (ref 135–146)
Total Protein: 7.6 g/dL (ref 6.1–8.1)

## 2016-02-08 LAB — CBC WITH DIFFERENTIAL/PLATELET
BASOS ABS: 63 {cells}/uL (ref 0–200)
Basophils Relative: 1 %
EOS ABS: 126 {cells}/uL (ref 15–500)
EOS PCT: 2 %
HCT: 41.7 % (ref 38.5–50.0)
HEMOGLOBIN: 14.6 g/dL (ref 13.2–17.1)
LYMPHS ABS: 1386 {cells}/uL (ref 850–3900)
Lymphocytes Relative: 22 %
MCH: 30.1 pg (ref 27.0–33.0)
MCHC: 35 g/dL (ref 32.0–36.0)
MCV: 86 fL (ref 80.0–100.0)
MPV: 11 fL (ref 7.5–12.5)
Monocytes Absolute: 1323 cells/uL — ABNORMAL HIGH (ref 200–950)
Monocytes Relative: 21 %
NEUTROS ABS: 3402 {cells}/uL (ref 1500–7800)
NEUTROS PCT: 54 %
Platelets: 209 10*3/uL (ref 140–400)
RBC: 4.85 MIL/uL (ref 4.20–5.80)
RDW: 13.7 % (ref 11.0–15.0)
WBC: 6.3 10*3/uL (ref 3.8–10.8)

## 2016-02-08 NOTE — Progress Notes (Signed)
Pre visit review using our clinic review tool, if applicable. No additional management support is needed unless otherwise documented below in the visit note. 

## 2016-02-08 NOTE — Progress Notes (Addendum)
OFFICE VISIT  02/08/2016   CC:  Chief Complaint  Patient presents with  . Abdominal Pain    upper x 5 days, worse when he eats  . Diarrhea    x 5 days   HPI:    Patient is a 40 y.o. Caucasian male who presents for 5d hx of abd pain and diarrhea. Started with loss of appetite, little nausea/bloating.  This part got better for a couple days then the diarrhea hit.  Sounds like mostly nocturnal bouts of watery BMs.  No blood in stool.  No vomiting. On first night he had chills and body aches, but no similar sx's since then. Now the mid epigastric abd bloating pain is back.  No appetite now.  If he eats small amount the pain is not bad.  If eats normal sized meal then hurts much worse/worse bloating.  Drinking lots of water. Wine irritated it. Tums calmed it down.  Gas-ex helps some.  No anti-diarrheal med taken.  No recent travel outside country.  No known sick contacts.  These sx's are not a recurrent issue with him, although he does have history of unexplained SBO 06/2014 for which he was briefly hospitalized for observation.  Past Medical History  Diagnosis Date  . Small bowel obstruction (HCC) 06/2014    No surgery necessary  . H/O vasectomy 2015    Past Surgical History  Procedure Laterality Date  . Lasik  2003    MEDS: none  No Known Allergies  ROS As per HPI  PE: Blood pressure 134/87, pulse 69, temperature 97.9 F (36.6 C), temperature source Oral, resp. rate 16, height 6' 0.75" (1.848 m), weight 208 lb 12 oz (94.688 kg), SpO2 99 %. Gen: Alert, well appearing.  Patient is oriented to person, place, time, and situation. AFFECT: pleasant, lucid thought and speech. NWG:NFAOENT:Eyes: no injection, icteris, swelling, or exudate.  EOMI, PERRLA. Mouth: lips without lesion/swelling.  Oral mucosa pink and moist. Oropharynx without erythema, exudate, or swelling.  Neck - No masses or thyromegaly or limitation in range of motion CV: RRR, no m/r/g.   LUNGS: CTA bilat, nonlabored  resps, good aeration in all lung fields. ABD: soft, nondistended, BS normal, with no mass or bruit or HSM.  He is mildly TTP in umbilical area and LLQ w/out guarding or rebound tenderness.  LABS:  None today  IMPRESSION AND PLAN:  Gastroenteritis, prolonged.  Pt does not appear dehydrated. Pt with history of unexplained SBO in the past that he says hurt in the same region as he hurts now. At this time I recommended no new meds but will need to tell him to start a daily PPI when we call him with his test results.   Will check CBC, CMET, stool studies, and urea breath test (pt was sent to [facility] in [place] to get this test). Will also order acute abd series.  An After Visit Summary was printed and given to the patient.  FOLLOW UP: Return for follow up to be determined based on results of work-up.  Signed:  Santiago BumpersPhil Tajay Muzzy, MD           02/08/2016  ADDENDUM 02/08/16, 4:45 PM:  abd x-rays showed ileus.  Called and notified pt.  Recommended liquid diet for 1-2 more days, start OTC PPI qd, and daily probiotic.  He will be bringing stool sample in tomorrow.--PM

## 2016-02-09 LAB — H. PYLORI BREATH TEST: H. PYLORI BREATH TEST: NOT DETECTED

## 2016-02-10 ENCOUNTER — Telehealth: Payer: Self-pay | Admitting: Family Medicine

## 2016-02-10 NOTE — Telephone Encounter (Signed)
Patient would like to know if he can begin eating regular diet again?  Patient states he is now having normal stool.  Please advise.  ( patient states he is so hungry, so he would like to know as soon as possible please )

## 2016-02-10 NOTE — Telephone Encounter (Signed)
Per Dr. Milinda CaveMcGowen if pt has no pain or discomfort and stools are back to normal pt can eat a regular diet. Pt advised and voiced understanding.

## 2016-02-10 NOTE — Addendum Note (Signed)
Addended by: Eulah PontALBRIGHT, Scottie Metayer M on: 02/10/2016 12:28 PM   Modules accepted: Orders

## 2016-02-11 LAB — FECAL LACTOFERRIN, QUANT: Lactoferrin: POSITIVE

## 2016-02-11 LAB — CLOSTRIDIUM DIFFICILE BY PCR: CDIFFPCR: NOT DETECTED

## 2016-02-12 LAB — ROTAVIRUS ANTIGEN, STOOL: Rotavirus: NOT DETECTED

## 2016-02-14 LAB — STOOL CULTURE

## 2016-02-15 LAB — GIARDIA/CRYPTOSPORIDIUM (EIA)

## 2016-12-21 IMAGING — DX DG ABDOMEN ACUTE W/ 1V CHEST
3 series · 3 of 3 positions shown · non-contrast
Comparison: Abdominal series of July 04, 2014

CLINICAL DATA: Five days of epigastric pain with loose stools;
history of obstruction 18 months ago.

EXAM:
DG ABDOMEN ACUTE W/ 1V CHEST

[chest pa]
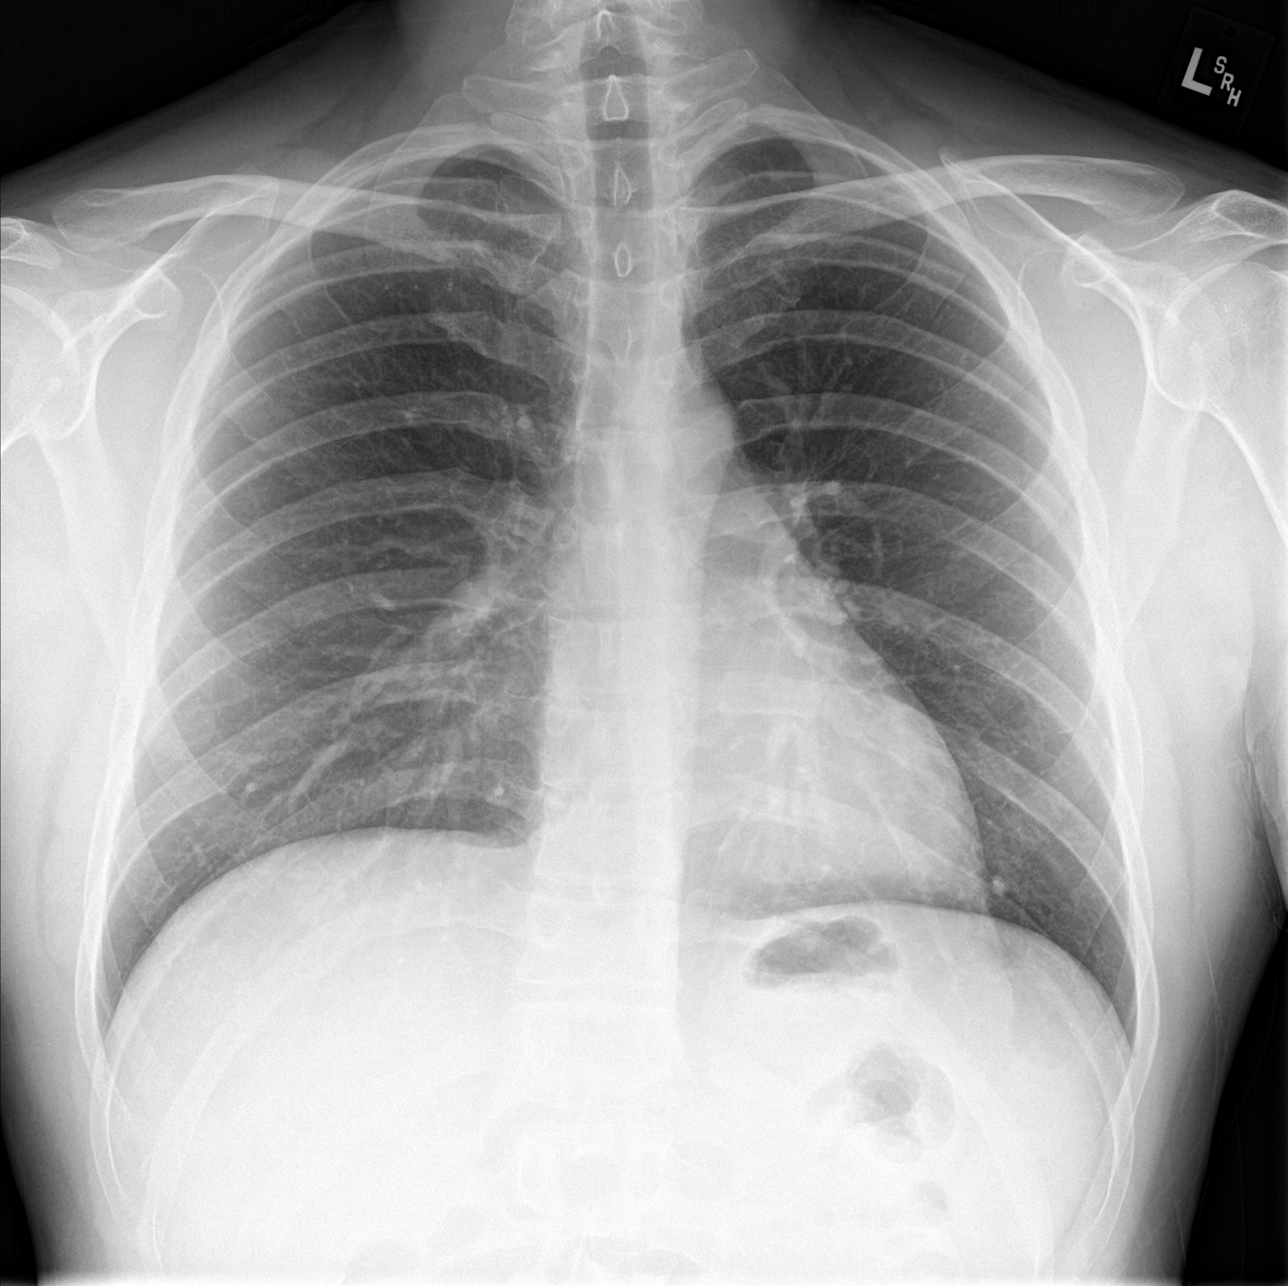

[abdomen erect]
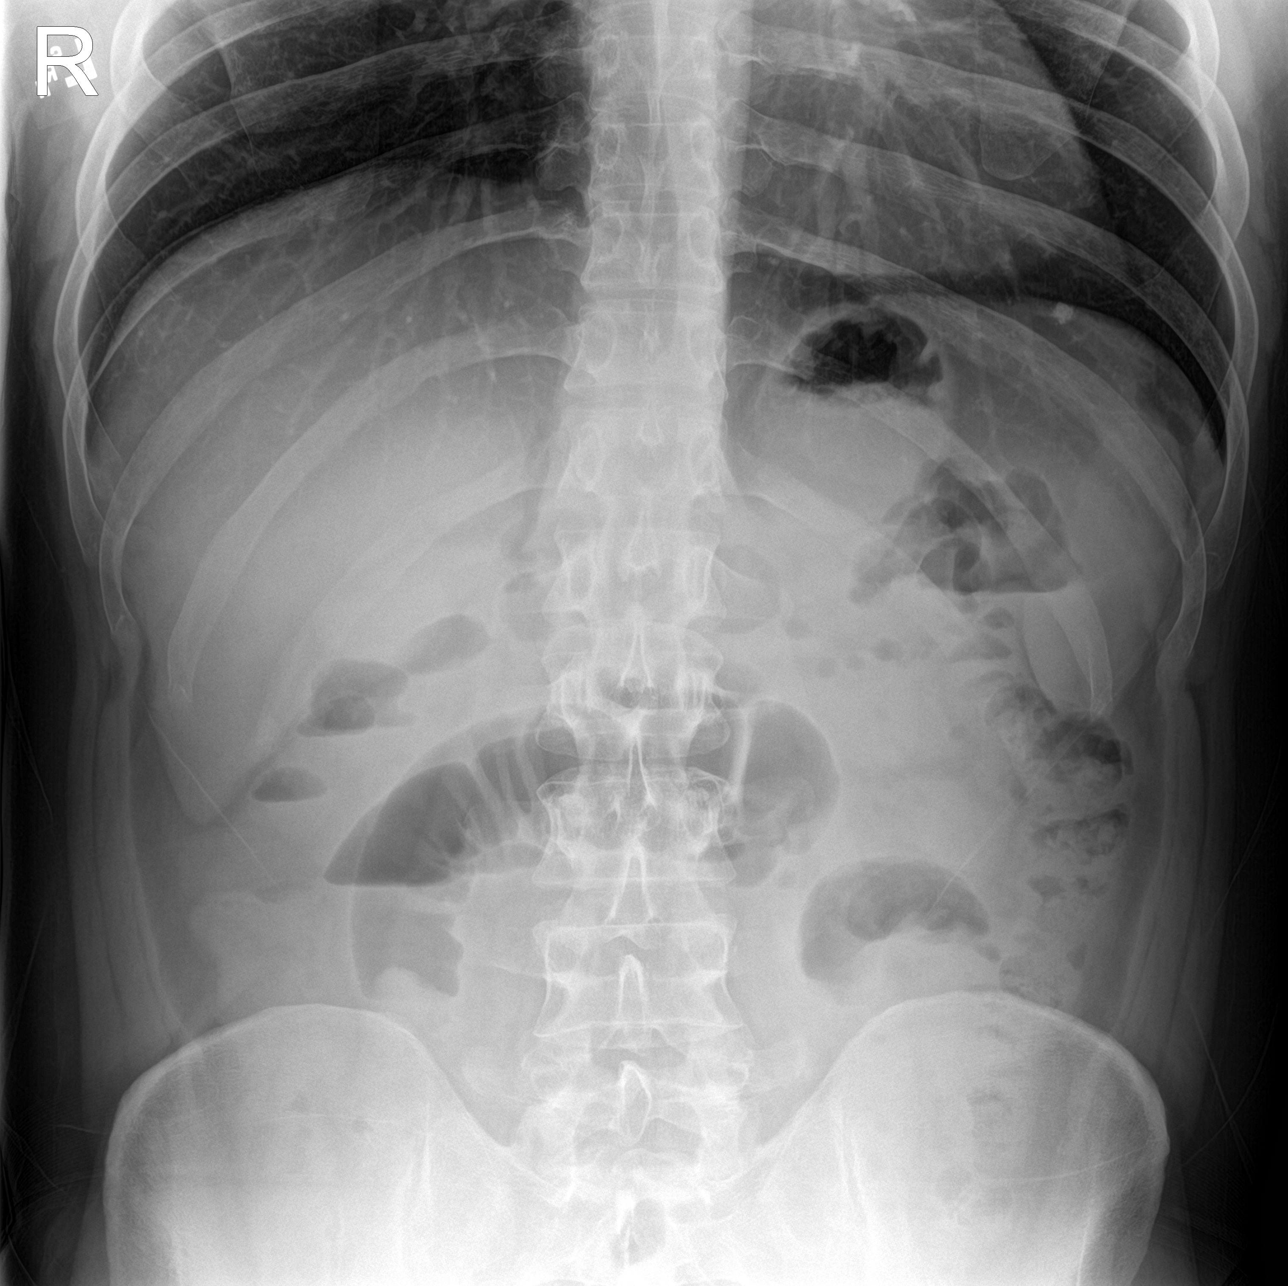

[abdomen supine]
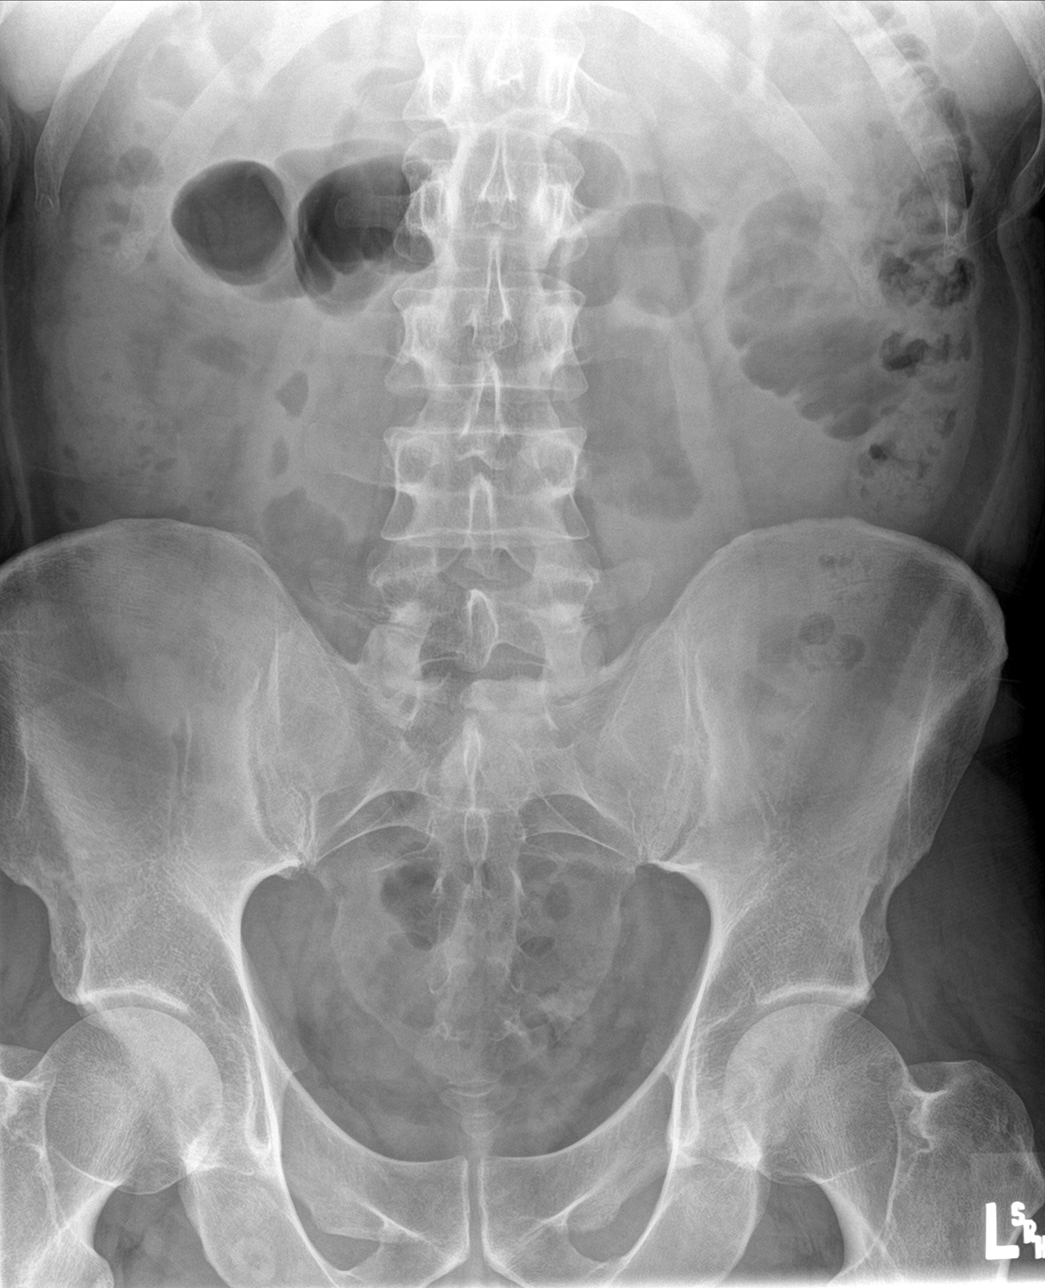

[3 of 3 positions shown; findings below may reference images not displayed]

FINDINGS: Chest x-ray: The lungs are well-expanded. There is a stable
calcified nodule just lateral to the left cardiac apex. The heart
and mediastinal structures are normal. There is no pleural effusion.

Within the abdomen there are small bowel air-fluid levels in the
midline. Small amounts of fluid are noted in the right colon. There
is some stool in the descending colon. There is no significant stool
or gas in the rectum.

The soft tissues of the abdomen exhibit no abnormal calcifications.
The observed bony is structures exhibit no acute abnormalities.
IMPRESSION: Findings consistent with mild small and large bowel ileus or less
likely a partial mid to distal small bowel obstruction. There is no
evidence of perforation.

## 2017-01-12 ENCOUNTER — Ambulatory Visit (INDEPENDENT_AMBULATORY_CARE_PROVIDER_SITE_OTHER): Payer: 59 | Admitting: Family Medicine

## 2017-01-12 ENCOUNTER — Encounter: Payer: Self-pay | Admitting: Family Medicine

## 2017-01-12 VITALS — BP 113/72 | HR 64 | Temp 97.8°F | Resp 16 | Ht 72.75 in | Wt 215.5 lb

## 2017-01-12 DIAGNOSIS — S56911A Strain of unspecified muscles, fascia and tendons at forearm level, right arm, initial encounter: Secondary | ICD-10-CM

## 2017-01-12 DIAGNOSIS — F5101 Primary insomnia: Secondary | ICD-10-CM | POA: Diagnosis not present

## 2017-01-12 MED ORDER — ALPRAZOLAM 0.5 MG PO TABS: ORAL_TABLET | ORAL | 3 refills | Status: DC

## 2017-01-12 NOTE — Progress Notes (Signed)
OFFICE VISIT  01/12/2017   CC:  Chief Complaint  Patient presents with  . Arm Pain    right forearm x 2 months, no known injury  . Insomnia    x 1-2 years   HPI:    Patient is a 41 y.o. Caucasian male who presents for right arm pain. About 2 mo ago, onset of pain in R arm prox ext region, no trauma.  Has abstained from working out (except running) since he has felt this. It has been pretty persistent initially, seems to be more intermittent lately and less intense. Stretching helps.  Took some ibup/aleve at first, didn't help much.  Tried a tennis elbow strap but this actually made the area hurt worse.  One to two yr problem with initiating and maintaining sleep. Mind starts running.  Feeling tired during day.  Tried tylenol pm--leads to hangover effect. Took his wife's xanax in the past and this helped great and did not lead to hangover effect.  Past Medical History:  Diagnosis Date  . H/O vasectomy 2015  . Small bowel obstruction (HCC) 06/2014   No surgery necessary    Past Surgical History:  Procedure Laterality Date  . LASIK  2003    Outpatient Medications Prior to Visit  Medication Sig Dispense Refill  . Omega-3 Fatty Acids (FISH OIL BURP-LESS PO) Take 1 capsule by mouth daily.     No facility-administered medications prior to visit.    No Known Allergies  ROS As per HPI  PE: Blood pressure 113/72, pulse 64, temperature 97.8 F (36.6 C), temperature source Oral, resp. rate 16, height 6' 0.75" (1.848 m), weight 215 lb 8 oz (97.8 kg), SpO2 99 %. Gen: Alert, well appearing.  Patient is oriented to person, place, time, and situation. AFFECT: pleasant, lucid thought and speech. Right arm: strength 5/5 prox/dist.  No tenderness over lateral or medial epicondyle.  No tenderness over typical distribution of extensor complex involved in lateral epicondylitis.  Mild TTP focally in muscle just medial to these extensors.  Pain elicited in the area when wrist extension is  resisted.  No pain with resistance of supination or pronation of R forearm.  No skin abnormalities.  No palpable mass.  LABS:  none  IMPRESSION AND PLAN:  1) Right forearm wrist extensor strain: improving gradually w/out treatment. Recommended application of heat x 20 min once daily, watchful waiting approach.  2) Insomnia, chronic; since he has gotten good response w/out "hangover effect" from use of xanax in the past, will get him started on xanax 0.5mg  qhs prn, #30, RF x 3.  An After Visit Summary was printed and given to the patient.  FOLLOW UP: Return in about 6 months (around 07/15/2017) for f/u insomnia.  Signed:  Santiago BumpersPhil Kennard Fildes, MD           01/12/2017

## 2017-01-19 ENCOUNTER — Ambulatory Visit (INDEPENDENT_AMBULATORY_CARE_PROVIDER_SITE_OTHER): Payer: 59 | Admitting: Family Medicine

## 2017-01-19 ENCOUNTER — Encounter: Payer: Self-pay | Admitting: Family Medicine

## 2017-01-19 VITALS — BP 117/73 | HR 60 | Temp 97.6°F | Resp 16 | Ht 73.0 in | Wt 216.8 lb

## 2017-01-19 DIAGNOSIS — Z0000 Encounter for general adult medical examination without abnormal findings: Secondary | ICD-10-CM

## 2017-01-19 LAB — CBC WITH DIFFERENTIAL/PLATELET
BASOS ABS: 0 10*3/uL (ref 0.0–0.1)
Basophils Relative: 0.6 % (ref 0.0–3.0)
EOS ABS: 0.1 10*3/uL (ref 0.0–0.7)
Eosinophils Relative: 1.4 % (ref 0.0–5.0)
HCT: 44.6 % (ref 39.0–52.0)
Hemoglobin: 15.3 g/dL (ref 13.0–17.0)
LYMPHS ABS: 1.1 10*3/uL (ref 0.7–4.0)
Lymphocytes Relative: 25.3 % (ref 12.0–46.0)
MCHC: 34.3 g/dL (ref 30.0–36.0)
MCV: 89.6 fl (ref 78.0–100.0)
MONOS PCT: 9.6 % (ref 3.0–12.0)
Monocytes Absolute: 0.4 10*3/uL (ref 0.1–1.0)
NEUTROS ABS: 2.8 10*3/uL (ref 1.4–7.7)
NEUTROS PCT: 63.1 % (ref 43.0–77.0)
PLATELETS: 155 10*3/uL (ref 150.0–400.0)
RBC: 4.97 Mil/uL (ref 4.22–5.81)
RDW: 12.9 % (ref 11.5–15.5)
WBC: 4.5 10*3/uL (ref 4.0–10.5)

## 2017-01-19 LAB — LIPID PANEL
CHOL/HDL RATIO: 5
CHOLESTEROL: 184 mg/dL (ref 0–200)
HDL: 40.8 mg/dL (ref >39.00)
LDL Cholesterol: 119 mg/dL — ABNORMAL HIGH (ref 0–99)
NonHDL: 142.86
TRIGLYCERIDES: 117 mg/dL (ref 0.0–149.0)
VLDL: 23.4 mg/dL (ref 0.0–40.0)

## 2017-01-19 LAB — COMPREHENSIVE METABOLIC PANEL
ALBUMIN: 4.9 g/dL (ref 3.5–5.2)
ALK PHOS: 59 U/L (ref 39–117)
ALT: 12 U/L (ref 0–53)
AST: 17 U/L (ref 0–37)
BILIRUBIN TOTAL: 1 mg/dL (ref 0.2–1.2)
BUN: 19 mg/dL (ref 6–23)
CALCIUM: 9.8 mg/dL (ref 8.4–10.5)
CO2: 30 meq/L (ref 19–32)
Chloride: 105 mEq/L (ref 96–112)
Creatinine, Ser: 1.11 mg/dL (ref 0.40–1.50)
GFR: 77.2 mL/min (ref >60.00)
Glucose, Bld: 80 mg/dL (ref 70–99)
Potassium: 4.1 mEq/L (ref 3.5–5.1)
Sodium: 140 mEq/L (ref 135–145)
TOTAL PROTEIN: 7.3 g/dL (ref 6.0–8.3)

## 2017-01-19 LAB — TSH: TSH: 1.18 u[IU]/mL (ref 0.35–4.50)

## 2017-01-19 NOTE — Progress Notes (Signed)
Office Note 01/19/2017  CC:  Chief Complaint  Patient presents with  . Annual Exam    Pt is fasting.     HPI:  Edwin Sparks is a 41 y.o. male who is here for annual health maintenance exam. No FH of colon ca or prostate ca.  Eyes: needs exam.  Dental: preventatives q 54mo.  Exercise: running 2-3 times per week.  DIET: healthy  Past Medical History:  Diagnosis Date  . H/O vasectomy 2015  . Small bowel obstruction (HCC) 06/2014   No surgery necessary    Past Surgical History:  Procedure Laterality Date  . LASIK  2003    Family History  Problem Relation Age of Onset  . Lymphoma Father 71       NHL; died age 6    Social History   Social History  . Marital status: Married    Spouse name: N/A  . Number of children: N/A  . Years of education: N/A   Occupational History  . Not on file.   Social History Main Topics  . Smoking status: Former Smoker    Quit date: 08/22/2001  . Smokeless tobacco: Never Used  . Alcohol use Yes     Comment: occ  . Drug use: No  . Sexual activity: Not on file   Other Topics Concern  . Not on file   Social History Narrative   Married, has one son and one daughter.   Relocated to [place] [facility] 2006.     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   Lives in [place].     No Tob, 1 glass of wine with dinner.  No drugs.   Exercises 4 d/week--cardio and wt lifting.    Outpatient Medications Prior to Visit  Medication Sig Dispense Refill  . ALPRAZolam (XANAX) 0.5 MG tablet 1 tab po qhs prn insomnia 30 tablet 3  . Omega-3 Fatty Acids (FISH OIL BURP-LESS PO) Take 1 capsule by mouth daily.    . Probiotic Product (PROBIOTIC PO) Take 1 capsule by mouth daily.     No facility-administered medications prior to visit.     No Known Allergies  ROS Review of Systems  Constitutional: Negative for appetite change, chills, fatigue and fever.  HENT: Negative for congestion, dental problem, ear pain and sore throat.    Eyes: Negative for discharge, redness and visual disturbance.  Respiratory: Negative for cough, chest tightness, shortness of breath and wheezing.   Cardiovascular: Negative for chest pain, palpitations and leg swelling.  Gastrointestinal: Negative for abdominal pain, blood in stool, diarrhea, nausea and vomiting.  Genitourinary: Negative for difficulty urinating, dysuria, flank pain, frequency, hematuria and urgency.  Musculoskeletal: Negative for arthralgias, back pain, joint swelling, myalgias and neck stiffness.  Skin: Negative for pallor and rash.  Neurological: Negative for dizziness, speech difficulty, weakness and headaches.  Hematological: Negative for adenopathy. Does not bruise/bleed easily.  Psychiatric/Behavioral: Negative for confusion and sleep disturbance. The patient is not nervous/anxious.     PE; Blood pressure 117/73, pulse 60, temperature 97.6 F (36.4 C), temperature source Oral, resp. rate 16, height 6\' 1"  (1.854 m), weight 216 lb 12 oz (98.3 kg), SpO2 98 %. Gen: Alert, well appearing.  Patient is oriented to person, place, time, and situation. AFFECT: pleasant, lucid thought and speech. ENT: Ears: EACs clear, normal epithelium.  TMs with good light reflex and landmarks bilaterally.  Eyes: no injection, icteris, swelling, or exudate.  EOMI, PERRLA. Nose: no drainage or turbinate edema/swelling.  No injection  or focal lesion.  Mouth: lips without lesion/swelling.  Oral mucosa pink and moist.  Dentition intact and without obvious caries or gingival swelling.  Oropharynx without erythema, exudate, or swelling.  Neck: supple/nontender.  No LAD, mass, or TM.  Carotid pulses 2+ bilaterally, without bruits. CV: RRR, no m/r/g.   LUNGS: CTA bilat, nonlabored resps, good aeration in all lung fields. ABD: soft, NT, ND, BS normal.  No hepatospenomegaly or mass.  No bruits. EXT: no clubbing, cyanosis, or edema.  Musculoskeletal: no joint swelling, erythema, warmth, or tenderness.   ROM of all joints intact. Skin - no sores or suspicious lesions or rashes or color changes  Pertinent labs:  Lab Results  Component Value Date   TSH 1.65 07/01/2015   Lab Results  Component Value Date   WBC 6.3 02/08/2016   HGB 14.6 02/08/2016   HCT 41.7 02/08/2016   MCV 86.0 02/08/2016   PLT 209 02/08/2016   Lab Results  Component Value Date   CREATININE 1.02 02/08/2016   BUN 13 02/08/2016   NA 139 02/08/2016   K 4.3 02/08/2016   CL 100 02/08/2016   CO2 27 02/08/2016   Lab Results  Component Value Date   ALT 14 02/08/2016   AST 17 02/08/2016   ALKPHOS 60 02/08/2016   BILITOT 1.0 02/08/2016   Lab Results  Component Value Date   CHOL 149 07/01/2015   Lab Results  Component Value Date   HDL 38.50 (L) 07/01/2015   Lab Results  Component Value Date   LDLCALC 95 07/01/2015   Lab Results  Component Value Date   TRIG 75.0 07/01/2015   Lab Results  Component Value Date   CHOLHDL 4 07/01/2015   ASSESSMENT AND PLAN:   Health maintenance exam: Reviewed age and gender appropriate health maintenance issues (prudent diet, regular exercise, health risks of tobacco and excessive alcohol, use of seatbelts, fire alarms in home, use of sunscreen).  Also reviewed age and gender appropriate health screening as well as vaccine recommendations. Vaccines UTD. Fasting HP labs today.  An After Visit Summary was printed and given to the patient.  FOLLOW UP:  Return in about 1 year (around 01/19/2018) for annual CPE (fasting).  Signed:  Santiago BumpersPhil Oslo Huntsman, MD           01/19/2017

## 2017-01-19 NOTE — Patient Instructions (Addendum)

## 2017-01-20 ENCOUNTER — Encounter: Payer: Self-pay | Admitting: *Deleted

## 2017-09-27 ENCOUNTER — Ambulatory Visit (INDEPENDENT_AMBULATORY_CARE_PROVIDER_SITE_OTHER): Payer: 59 | Admitting: Family Medicine

## 2017-09-27 ENCOUNTER — Encounter: Payer: Self-pay | Admitting: Family Medicine

## 2017-09-27 VITALS — BP 132/78 | HR 55 | Temp 97.3°F | Ht 73.0 in | Wt 206.8 lb

## 2017-09-27 DIAGNOSIS — H1033 Unspecified acute conjunctivitis, bilateral: Secondary | ICD-10-CM

## 2017-09-27 MED ORDER — POLYMYXIN B-TRIMETHOPRIM 10000-0.1 UNIT/ML-% OP SOLN
1.0000 [drp] | Freq: Four times a day (QID) | OPHTHALMIC | 0 refills | Status: AC
Start: 2017-09-27 — End: 2017-10-07

## 2017-09-27 NOTE — Patient Instructions (Signed)

## 2017-09-27 NOTE — Progress Notes (Signed)
QUINTEL MCCALLA , 1975/10/02, 42 y.o., male MRN: [id number] Patient Care Team    Relationship Specialty Notifications Start End  McGowen, Maryjean Morn, MD PCP - General Family Medicine  09/03/12     Chief Complaint  Patient presents with  . Conjunctivitis    pt c/o of pink eye on both eyes X 4 day, try using saline drop for relief.     Subjective: Pt presents for an OV with complaints of  Eye discomfort of 4 days duration.  Associated symptoms include red, itchy, draining left eye, that has spread to his right high over the last day. He reports he was fighting an upper respiratory cold the week prior. He endorses having his left eye matted shut with drainage in the morning. He is not a contact lens wearer. Pt has tried saline drops to ease their symptoms.   Depression screen PHQ 2/9 Oct 13, 2017  Decreased Interest 0  Down, Depressed, Hopeless 0  PHQ - 2 Score 0    No Known Allergies Social History   Tobacco Use  . Smoking status: Former Smoker    Last attempt to quit: 08/22/2001    Years since quitting: 16.1  . Smokeless tobacco: Never Used  Substance Use Topics  . Alcohol use: Yes    Comment: occ   Past Medical History:  Diagnosis Date  . H/O vasectomy 2015  . Small bowel obstruction (HCC) 06/2014   No surgery necessary   Past Surgical History:  Procedure Laterality Date  . LASIK  2003   Family History  Problem Relation Age of Onset  . Lymphoma Father 75       NHL; died age 31   Allergies as of 10/13/17   No Known Allergies     Medication List        Accurate as of October 13, 2017 10:36 AM. Always use your most recent med list.          ALPRAZolam 0.5 MG tablet Commonly known as:  XANAX 1 tab po qhs prn insomnia   FISH OIL BURP-LESS PO Take 1 capsule by mouth daily.   PROBIOTIC PO Take 1 capsule by mouth daily.       All past medical history, surgical history, allergies, family history, immunizations andmedications were updated in the EMR today and  reviewed under the history and medication portions of their EMR.     ROS: Negative, with the exception of above mentioned in HPI   Objective:  BP 132/78 (BP Location: Left Arm, Patient Position: Sitting, Cuff Size: Normal)   Pulse (!) 55   Temp (!) 97.3 F (36.3 C) (Oral)   Ht 6\' 1"  (1.854 m)   Wt 206 lb 12.8 oz (93.8 kg)   SpO2 100%   BMI 27.28 kg/m  Body mass index is 27.28 kg/m. Gen: Afebrile. No acute distress. Nontoxic in appearance, well developed, well nourished.  HENT: AT. [place]. Bilateral TM visualized without erythema or fullness. MMM, no oral lesions. Bilateral nares without erythema or drainage. Throat without erythema or exudates. No cough. No hoarseness. Eyes:Pupils Equal Round Reactive to light, Extraocular movements intact,  left Conjunctiva with redness, no discharge or icterus. Right high with very mild redness. No light sensitivity. No pain with pressure. Neck/lymp/endocrine: Supple, no lymphadenopathy  No exam data present No results found. No results found for this or any previous visit (from the past 24 hour(s)).  Assessment/Plan: ALEXUS MICHAEL is a 42 y.o. male present for OV for  Acute bacterial conjunctivitis of  both eyes - Polytrim eyedrops prescribed. Warm compresses/washes encouraged. - AVS on conjunctivitis provided. - Follow-up when necessary   Reviewed expectations re: course of current medical issues.  Discussed self-management of symptoms.  Outlined signs and symptoms indicating need for more acute intervention.  Patient verbalized understanding and all questions were answered.  Patient received an After-Visit Summary.    No orders of the defined types were placed in this encounter.    Note is dictated utilizing voice recognition software. Although note has been proof read prior to signing, occasional typographical errors still can be missed. If any questions arise, please do not hesitate to call for verification.    electronically signed by:  Felix Pacinienee Kuneff, DO  [facility] [place]

## 2017-11-02 ENCOUNTER — Encounter: Payer: Self-pay | Admitting: Family Medicine

## 2017-11-02 ENCOUNTER — Ambulatory Visit (INDEPENDENT_AMBULATORY_CARE_PROVIDER_SITE_OTHER): Payer: 59 | Admitting: Family Medicine

## 2017-11-02 ENCOUNTER — Telehealth: Payer: Self-pay | Admitting: Family Medicine

## 2017-11-02 VITALS — BP 126/87 | HR 68 | Resp 16 | Ht 73.0 in | Wt 205.0 lb

## 2017-11-02 DIAGNOSIS — H1089 Other conjunctivitis: Secondary | ICD-10-CM | POA: Diagnosis not present

## 2017-11-02 DIAGNOSIS — H538 Other visual disturbances: Secondary | ICD-10-CM

## 2017-11-02 MED ORDER — OFLOXACIN 0.3 % OP SOLN: OPHTHALMIC | 0 refills | Status: AC

## 2017-11-02 NOTE — Patient Instructions (Signed)
Start eye drops today. It is a taper so pay attention to the label.  I have referred you to an eye doctor for further evaluation as well.

## 2017-11-02 NOTE — Progress Notes (Signed)
Edwin DowningBenjamin A Sparks , [birth year], 42 y.o., male MRN: [id number] Patient Care Team    Relationship Specialty Notifications Start End  McGowen, Maryjean MornPhilip H, MD PCP - General Family Medicine  09/03/12     Chief Complaint  Patient presents with  . Conjunctivitis     Subjective:  Patient presents again today for similar symptoms that he was seen one month ago. He reports his conjunctivitis had cleared with the use of the polymycin eyedrops. He still has an occasional itchy eye, but overall infection had resolved. Unfortunately his entire family eventually ended up taking turns getting the bacterial conjunctivitis and treated with their own polymycin and all of their impactions also completely resolved. Patient states 1-2 days after his last child was diagnosed, his own symptoms started to return. He started to take the left over [facility] my[facility] and has been taking this for approximately 7 days. He states that his symptoms are improving, but are not resolving as they were last time. He also reports mild blurred vision and states he sees some spots on his eyeball.  Prior notes 09/27/2017: Pt presents for an OV with complaints of  Eye discomfort of 4 days duration.  Associated symptoms include red, itchy, draining left eye, that has spread to his right high over the last day. He reports he was fighting an upper respiratory cold the week prior. He endorses having his left eye matted shut with drainage in the morning. He is not a contact lens wearer. Pt has tried saline drops to ease their symptoms.   Depression screen PHQ 2/9 09/27/2017  Decreased Interest 0  Down, Depressed, Hopeless 0  PHQ - 2 Score 0    No Known Allergies Social History   Tobacco Use  . Smoking status: Former Smoker    Last attempt to quit: 08/22/2001    Years since quitting: 16.2  . Smokeless tobacco: Never Used  Substance Use Topics  . Alcohol use: Yes    Comment: occ   Past Medical History:  Diagnosis Date  . H/O  vasectomy 2015  . Small bowel obstruction (HCC) 06/2014   No surgery necessary   Past Surgical History:  Procedure Laterality Date  . LASIK  2003   Family History  Problem Relation Age of Onset  . Lymphoma Father [id number]       NHL; died age 43   Allergies as of 11/02/2017   No Known Allergies     Medication List        Accurate as of 11/02/17 11:31 AM. Always use your most recent med list.          ALPRAZolam 0.5 MG tablet Commonly known as:  XANAX 1 tab po qhs prn insomnia   FISH OIL BURP-LESS PO Take 1 capsule by mouth daily.   PROBIOTIC PO Take 1 capsule by mouth daily.       All past medical history, surgical history, allergies, family history, immunizations andmedications were updated in the EMR today and reviewed under the history and medication portions of their EMR.     ROS: Negative, with the exception of above mentioned in HPI   Objective:  BP 126/87 (BP Location: Left Arm, Patient Position: Sitting, Cuff Size: Normal)   Pulse 68   Resp 16   Ht 6\' 1"  (1.854 m)   Wt 205 lb (93 kg)   SpO2 99%   BMI 27.05 kg/m  Body mass index is 27.05 kg/m.  Gen: Afebrile. No acute distress.  HENT: AT. [place].  Bilateral TM visualized and normal in appearance. MMM. Bilateral nares without erythema or drainage. Throat without erythema or exudates.  Eyes:Pupils Equal Round Reactive to light, Extraocular movements intact,  Conjunctiva without redness, discharge or icterus. Small area left lateral cornea could be consistent with small  ulceration. No light sensitivity. No pain with eye pressure.  Neck/lymp/endocrine: Supple,no lymphadenopathy  No exam data present No results found. No results found for this or any previous visit (from the past 24 hour(s)).  Assessment/Plan: TAIWO FISH is a 42 y.o. male present for OV for  recurrent bacterial conjunctivitis of both eyes - Ocuflox eyedrops prescribed. Ophthalmology referral placed. Possibly could have become reinfected  although would have expected better response to the Polytrim eyedrops given it clear infection the first time around and cleared infection and all of his family. Discussed with him the possibility of keratitis with patient.  Although small chaange, any change in vision is concerning. Therefore will send to ophthalmology for for evaluation. Patient is agreeable to this today.   Reviewed expectations re: course of current medical issues.  Discussed self-management of symptoms.  Outlined signs and symptoms indicating need for more acute intervention.  Patient verbalized understanding and all questions were answered.  Patient received an After-Visit Summary.    No orders of the defined types were placed in this encounter.    Note is dictated utilizing voice recognition software. Although note has been proof read prior to signing, occasional typographical errors still can be missed. If any questions arise, please do not hesitate to call for verification.   electronically signed by:  Felix Pacini, DO  [facility] [place]

## 2017-11-02 NOTE — Telephone Encounter (Signed)
Copied from CRM [id number]. Topic: Quick Communication - See Telephone Encounter >> Nov 02, 2017  1:58 PM Lelon FrohlichGolden, Tashia, RMA wrote: CRM for notification. See Telephone encounter for:   11/02/17.pt would like OV notes faxed to eye doctor fax is [phone]

## 2017-11-02 NOTE — Telephone Encounter (Signed)
Faxed notes to [facility]

## 2017-11-03 DIAGNOSIS — H04123 Dry eye syndrome of bilateral lacrimal glands: Secondary | ICD-10-CM | POA: Diagnosis not present

## 2017-11-03 DIAGNOSIS — H1820 Unspecified corneal edema: Secondary | ICD-10-CM | POA: Diagnosis not present

## 2017-11-03 DIAGNOSIS — B309 Viral conjunctivitis, unspecified: Secondary | ICD-10-CM | POA: Diagnosis not present

## 2017-11-13 DIAGNOSIS — H1820 Unspecified corneal edema: Secondary | ICD-10-CM | POA: Diagnosis not present

## 2017-11-13 DIAGNOSIS — B309 Viral conjunctivitis, unspecified: Secondary | ICD-10-CM | POA: Diagnosis not present

## 2017-11-13 DIAGNOSIS — H04123 Dry eye syndrome of bilateral lacrimal glands: Secondary | ICD-10-CM | POA: Diagnosis not present

## 2017-12-20 DIAGNOSIS — E061 Subacute thyroiditis: Secondary | ICD-10-CM

## 2017-12-20 HISTORY — DX: Subacute thyroiditis: E06.1

## 2018-01-16 DIAGNOSIS — J029 Acute pharyngitis, unspecified: Secondary | ICD-10-CM | POA: Diagnosis not present

## 2018-01-29 ENCOUNTER — Other Ambulatory Visit: Payer: Self-pay | Admitting: *Deleted

## 2018-01-29 ENCOUNTER — Ambulatory Visit (INDEPENDENT_AMBULATORY_CARE_PROVIDER_SITE_OTHER): Payer: Self-pay | Admitting: Family Medicine

## 2018-01-29 ENCOUNTER — Encounter: Payer: Self-pay | Admitting: Family Medicine

## 2018-01-29 VITALS — BP 119/70 | HR 67 | Temp 97.7°F | Resp 16 | Ht 73.0 in | Wt 204.1 lb

## 2018-01-29 DIAGNOSIS — R519 Headache, unspecified: Secondary | ICD-10-CM

## 2018-01-29 DIAGNOSIS — M791 Myalgia, unspecified site: Secondary | ICD-10-CM

## 2018-01-29 DIAGNOSIS — R5383 Other fatigue: Secondary | ICD-10-CM

## 2018-01-29 DIAGNOSIS — B349 Viral infection, unspecified: Secondary | ICD-10-CM

## 2018-01-29 DIAGNOSIS — R51 Headache: Secondary | ICD-10-CM

## 2018-01-29 LAB — CBC WITH DIFFERENTIAL/PLATELET
BASOS PCT: 0.3 % (ref 0.0–3.0)
Basophils Absolute: 0 10*3/uL (ref 0.0–0.1)
EOS ABS: 0.1 10*3/uL (ref 0.0–0.7)
Eosinophils Relative: 1.4 % (ref 0.0–5.0)
HEMATOCRIT: 38.2 % — AB (ref 39.0–52.0)
Hemoglobin: 13.2 g/dL (ref 13.0–17.0)
LYMPHS ABS: 1 10*3/uL (ref 0.7–4.0)
LYMPHS PCT: 18.9 % (ref 12.0–46.0)
MCHC: 34.6 g/dL (ref 30.0–36.0)
MCV: 85 fl (ref 78.0–100.0)
Monocytes Absolute: 0.6 10*3/uL (ref 0.1–1.0)
Monocytes Relative: 10.8 % (ref 3.0–12.0)
NEUTROS ABS: 3.7 10*3/uL (ref 1.4–7.7)
Neutrophils Relative %: 68.6 % (ref 43.0–77.0)
PLATELETS: 237 10*3/uL (ref 150.0–400.0)
RBC: 4.5 Mil/uL (ref 4.22–5.81)
RDW: 12.5 % (ref 11.5–15.5)
WBC: 5.5 10*3/uL (ref 4.0–10.5)

## 2018-01-29 LAB — COMPREHENSIVE METABOLIC PANEL
ALBUMIN: 4.5 g/dL (ref 3.5–5.2)
ALT: 16 U/L (ref 0–53)
AST: 17 U/L (ref 0–37)
Alkaline Phosphatase: 82 U/L (ref 39–117)
BUN: 17 mg/dL (ref 6–23)
CALCIUM: 9.9 mg/dL (ref 8.4–10.5)
CHLORIDE: 103 meq/L (ref 96–112)
CO2: 30 meq/L (ref 19–32)
Creatinine, Ser: 0.86 mg/dL (ref 0.40–1.50)
GFR: 103.13 mL/min (ref >60.00)
Glucose, Bld: 91 mg/dL (ref 70–99)
POTASSIUM: 4.2 meq/L (ref 3.5–5.1)
SODIUM: 142 meq/L (ref 135–145)
Total Bilirubin: 0.6 mg/dL (ref 0.2–1.2)
Total Protein: 7.2 g/dL (ref 6.0–8.3)

## 2018-01-29 LAB — SEDIMENTATION RATE: SED RATE: 12 mm/h (ref 0–15)

## 2018-01-29 LAB — MONONUCLEOSIS SCREEN: Mono Screen: NEGATIVE

## 2018-01-29 LAB — CK: Total CK: 68 U/L (ref 7–232)

## 2018-01-29 MED ORDER — DOXYCYCLINE HYCLATE 100 MG PO CAPS: ORAL_CAPSULE | ORAL | 0 refills | Status: DC

## 2018-01-29 MED ORDER — DOXYCYCLINE HYCLATE 100 MG PO CAPS: 100.0000 mg | ORAL_CAPSULE | Freq: Two times a day (BID) | ORAL | 0 refills | Status: DC

## 2018-01-29 NOTE — Progress Notes (Signed)
OFFICE VISIT  01/29/2018   CC:  Chief Complaint  Patient presents with  . Fatigue    URI, bodyache, earache   HPI:    Patient is a 42 y.o. Caucasian male who presents for earache. Approx 16 days URI sx's with very sore throat but no cough, body aches (mostly myalgias) fatigue. Lately still fatigued, HA, neck stiffness present but nasal congestion and ST gone. Seemed to have a period of 24-36h of feeling back to normal. Then got chills/fever x 1 night.  Has continued with generalized fatigue, HA, some body achiness. Ears aching last few days, L>R.  Has been using polytrim ear drops since then. Had some chills last night again.  No known tick bite or rash.  One yellow stool during this time.  NO red/brown urine. Appetite/PO intake is great.  No abd pain, no n/v.     Past Medical History:  Diagnosis Date  . H/O vasectomy 2015  . Small bowel obstruction ([place]) 06/2014   No surgery necessary    Past Surgical History:  Procedure Laterality Date  . LASIK  2003    Outpatient Medications Prior to Visit  Medication Sig Dispense Refill  . ALPRAZolam (XANAX) 0.5 MG tablet 1 tab po qhs prn insomnia 30 tablet 3  . Omega-3 Fatty Acids (FISH OIL BURP-LESS PO) Take 1 capsule by mouth daily.    . Probiotic Product (PROBIOTIC PO) Take 1 capsule by mouth daily.     No facility-administered medications prior to visit.     No Known Allergies  ROS As per HPI  PE: Blood pressure 119/70, pulse 67, temperature 97.7 F (36.5 C), temperature source Oral, resp. rate 16, height '6\' 1"'  (1.854 m), weight 204 lb 2 oz (92.6 kg), SpO2 100 %. Body mass index is 26.93 kg/m.  Gen: Alert, well appearing.  Patient is oriented to person, place, time, and situation. AFFECT: pleasant, lucid thought and speech. ENT: Ears: EACs clear, normal epithelium.  TMs with good light reflex and landmarks bilaterally.  Eyes: no injection, icteris, swelling, or exudate.  EOMI, PERRLA. Nose: no drainage or turbinate  edema/swelling.  No injection or focal lesion.  Mouth: lips without lesion/swelling.  Oral mucosa pink and moist.  Dentition intact and without obvious caries or gingival swelling.  Oropharynx without erythema, exudate, or swelling.  NECK: no palpable LAD or mass or thyromegaly.  Nontender thyroid.  Some mild upper posterior cervical soft tissue TTP bilat, no palpable lymphadenopathy. CV: RRR, no m/r/g.   LUNGS: CTA bilat, nonlabored resps, good aeration in all lung fields. EXT: no clubbing, cyanosis, or edema.  NECK: no nuchal rigidity.  Mild posterior neck discomfort/tightness with brudzinski's but no meningeal signs. Kernig's neg. SKIN: no rash or focal lesions.  LABS:    Chemistry      Component Value Date/Time   NA 140 01/19/2017 1021   K 4.1 01/19/2017 1021   CL 105 01/19/2017 1021   CO2 30 01/19/2017 1021   BUN 19 01/19/2017 1021   CREATININE 1.11 01/19/2017 1021   CREATININE 1.02 02/08/2016 1356      Component Value Date/Time   CALCIUM 9.8 01/19/2017 1021   ALKPHOS 59 01/19/2017 1021   AST 17 01/19/2017 1021   ALT 12 01/19/2017 1021   BILITOT 1.0 01/19/2017 1021      IMPRESSION AND PLAN:   Viral syndrome most likely (? EBV). Continue tylenol prn.  Doxycycline 100 mg bid x 10d-->empiric tx for tick born illness. Check CBC w/diff, CMET, TSH, monospot, ESR,  CPK.  An After Visit Summary was printed and given to the patient.  FOLLOW UP: Return if symptoms worsen or fail to improve.  Signed:  Crissie Sickles, MD           01/29/2018

## 2018-01-29 NOTE — Addendum Note (Signed)
Addended by: Jeoffrey MassedMCGOWEN, Anjalina Bergevin H on: 01/29/2018 11:00 AM   Modules accepted: Orders

## 2018-01-29 NOTE — Telephone Encounter (Signed)
Recevied fax from pharmacy stating that sig and qty did not match up for the doxy that was eRxed today. They are requesting new Rx with corrections. Please advise. Thanks.

## 2018-01-30 ENCOUNTER — Other Ambulatory Visit (INDEPENDENT_AMBULATORY_CARE_PROVIDER_SITE_OTHER): Payer: Self-pay

## 2018-01-30 ENCOUNTER — Other Ambulatory Visit: Payer: Self-pay | Admitting: Family Medicine

## 2018-01-30 DIAGNOSIS — R7989 Other specified abnormal findings of blood chemistry: Secondary | ICD-10-CM

## 2018-01-30 LAB — TSH: TSH: <0.01 u[IU]/mL — ABNORMAL LOW (ref 0.35–4.50)

## 2018-01-30 LAB — T4, FREE: Free T4: 1.87 ng/dL — ABNORMAL HIGH (ref 0.60–1.60)

## 2018-01-31 ENCOUNTER — Other Ambulatory Visit (INDEPENDENT_AMBULATORY_CARE_PROVIDER_SITE_OTHER): Payer: Self-pay

## 2018-01-31 ENCOUNTER — Other Ambulatory Visit: Payer: Self-pay | Admitting: Family Medicine

## 2018-01-31 DIAGNOSIS — E0590 Thyrotoxicosis, unspecified without thyrotoxic crisis or storm: Secondary | ICD-10-CM

## 2018-01-31 LAB — TSH: TSH: <0.01 u[IU]/mL — ABNORMAL LOW (ref 0.35–4.50)

## 2018-01-31 LAB — T4, FREE: Free T4: 1.74 ng/dL — ABNORMAL HIGH (ref 0.60–1.60)

## 2018-01-31 LAB — T3: T3 TOTAL: 150 ng/dL (ref 76–181)

## 2018-02-02 ENCOUNTER — Encounter: Payer: Self-pay | Admitting: *Deleted

## 2018-02-03 LAB — THYROTROPIN RECEPTOR AUTOABS: THYROTROPIN RECEPTOR AB: 8.4 % (ref <=16.0)

## 2018-02-03 LAB — THYROID PEROXIDASE ANTIBODY: THYROID PEROXIDASE ANTIBODY: 2 [IU]/mL (ref <9)

## 2018-02-03 LAB — T3: T3, Total: 147 ng/dL (ref 76–181)

## 2018-02-06 ENCOUNTER — Encounter: Payer: Self-pay | Admitting: Family Medicine

## 2018-02-06 DIAGNOSIS — R7989 Other specified abnormal findings of blood chemistry: Secondary | ICD-10-CM

## 2018-02-06 DIAGNOSIS — E0590 Thyrotoxicosis, unspecified without thyrotoxic crisis or storm: Secondary | ICD-10-CM

## 2018-02-06 NOTE — Telephone Encounter (Signed)
OK, referral ordered 

## 2018-02-07 NOTE — Telephone Encounter (Signed)
[name], she stated that Dr. Daune PerchKerr's office can see pt in August this year and [facility] can see pt in September this year.   Please see pts Mychart message. Thanks.

## 2018-02-07 NOTE — Telephone Encounter (Signed)
Yes, I was afraid of the potential wait for an appt. I asked our referral coordinator to see if [facility] has a spot open sooner and will also look into an endocrinologist in [place] as well as [place]. In the meantime, I will order a test called a thyroid uptake scan.  They inject iodine tagged with a non-harmful radioactive particle that can be picked up by a special scanner.  This shows the location and intensity of thyroid gland activity and can help narrow down the list of possible diagnoses as the cause for the elevated thyroid level. I will order this test for med center [place] [facility]. You'll get called about scheduling it and we'll call with result.

## 2018-02-07 NOTE — Telephone Encounter (Signed)
Please advise. Thanks.  

## 2018-02-21 DIAGNOSIS — E061 Subacute thyroiditis: Secondary | ICD-10-CM | POA: Diagnosis not present

## 2018-02-26 ENCOUNTER — Encounter (HOSPITAL_COMMUNITY): Payer: 59

## 2018-02-27 ENCOUNTER — Encounter (HOSPITAL_COMMUNITY): Payer: 59

## 2018-02-28 ENCOUNTER — Encounter: Payer: Self-pay | Admitting: Family Medicine

## 2018-05-04 DIAGNOSIS — E061 Subacute thyroiditis: Secondary | ICD-10-CM | POA: Diagnosis not present

## 2018-05-30 ENCOUNTER — Encounter: Payer: Self-pay | Admitting: Family Medicine

## 2018-05-30 ENCOUNTER — Ambulatory Visit (INDEPENDENT_AMBULATORY_CARE_PROVIDER_SITE_OTHER): Payer: 59 | Admitting: Family Medicine

## 2018-05-30 VITALS — BP 115/71 | HR 53 | Temp 97.5°F | Resp 16 | Ht 73.0 in | Wt 207.2 lb

## 2018-05-30 DIAGNOSIS — Z13220 Encounter for screening for lipoid disorders: Secondary | ICD-10-CM

## 2018-05-30 DIAGNOSIS — E061 Subacute thyroiditis: Secondary | ICD-10-CM | POA: Diagnosis not present

## 2018-05-30 DIAGNOSIS — Z0000 Encounter for general adult medical examination without abnormal findings: Secondary | ICD-10-CM

## 2018-05-30 DIAGNOSIS — Z23 Encounter for immunization: Secondary | ICD-10-CM | POA: Diagnosis not present

## 2018-05-30 DIAGNOSIS — R5383 Other fatigue: Secondary | ICD-10-CM | POA: Diagnosis not present

## 2018-05-30 DIAGNOSIS — R6889 Other general symptoms and signs: Secondary | ICD-10-CM | POA: Diagnosis not present

## 2018-05-30 LAB — LIPID PANEL
CHOLESTEROL: 203 mg/dL — AB (ref 0–200)
HDL: 42.5 mg/dL (ref >39.00)
LDL Cholesterol: 131 mg/dL — ABNORMAL HIGH (ref 0–99)
NonHDL: 160.89
TRIGLYCERIDES: 149 mg/dL (ref 0.0–149.0)
Total CHOL/HDL Ratio: 5
VLDL: 29.8 mg/dL (ref 0.0–40.0)

## 2018-05-30 LAB — T4, FREE: FREE T4: 0.79 ng/dL (ref 0.60–1.60)

## 2018-05-30 LAB — TSH: TSH: 4.77 u[IU]/mL — AB (ref 0.35–4.50)

## 2018-05-30 NOTE — Patient Instructions (Signed)

## 2018-05-30 NOTE — Progress Notes (Signed)
Office Note 05/30/2018  CC:  Chief Complaint  Patient presents with  . Annual Exam    Pt is not fasting.     HPI:  Edwin Sparks is a 42 y.o. White male who is here for annual health maintenance exam. Pt with hx of subacute thyroiditis for which he saw Dr. Morrison Old in endo. F/u labs about 1 mo ago still showed that TSH was about 11 and T4/T3 were in the lower end of normal range per pt's recollection. Pt was not started on any thyroid med, still going with the expectant management path.  However, pt is concerned b/c he is still feeling a lot more tired lately, c/w possible symptomatic hypothyroidism.  Also some mild cold intolerance. Mild constipation, but managing this well with diet. We reviewed his office note and labs from Dr. Morrison Old from 02/21/18.  I don't have his most recent lab results available at this time.  Exercise: still running 3 miles twice a week. Diet: healthy  Past Medical History:  Diagnosis Date  . H/O vasectomy 2015  . Small bowel obstruction (HCC) 06/2014   No surgery necessary  . 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 Morrison Old 02/2018-->retesting of thyroid labs showed pt is now in HYPOthyroid phase of thyroiditis---likely will return to normal spontaneously, endo f/u arranged.    Past Surgical History:  Procedure Laterality Date  . LASIK  2003    Family History  Problem Relation Age of Onset  . Lymphoma Father 56       NHL; died age 49    Social History   Socioeconomic History  . Marital status: Married    Spouse name: Not on file  . Number of children: Not on file  . Years of education: Not on file  . Highest education level: Not on file  Occupational History  . Not on file  Social Needs  . Financial resource strain: Not on file  . Food insecurity:    Worry: Not on file    Inability: Not on file  . Transportation needs:    Medical: Not on file    Non-medical: Not on file  Tobacco Use  . Smoking status: Former Smoker    Last  attempt to quit: 08/22/2001    Years since quitting: 16.7  . Smokeless tobacco: Never Used  Substance and Sexual Activity  . Alcohol use: Yes    Comment: occ  . Drug use: No  . Sexual activity: Not on file  Lifestyle  . Physical activity:    Days per week: Not on file    Minutes per session: Not on file  . Stress: Not on file  Relationships  . Social connections:    Talks on phone: Not on file    Gets together: Not on file    Attends religious service: Not on file    Active member of club or organization: Not on file    Attends meetings of clubs or organizations: Not on file    Relationship status: Not on file  . Intimate partner violence:    Fear of current or ex partner: Not on file    Emotionally abused: Not on file    Physically abused: Not on file    Forced sexual activity: Not on file  Other Topics Concern  . Not on file  Social History Narrative   Married, has one son and one daughter.   Relocated to [facility] 2006.     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  Lives in [place].     No Tob, 1 glass of wine with dinner.  No drugs.   Exercises 4 d/week--cardio and wt lifting.    Outpatient Medications Prior to Visit  Medication Sig Dispense Refill  . ALPRAZolam (XANAX) 0.5 MG tablet 1 tab po qhs prn insomnia 30 tablet 3  . Omega-3 Fatty Acids (FISH OIL BURP-LESS PO) Take 1 capsule by mouth daily.    . Probiotic Product (PROBIOTIC PO) Take 1 capsule by mouth daily.    Marland Kitchen doxycycline (VIBRAMYCIN) 100 MG capsule 1 tab po bid x 10d (Patient not taking: Reported on 05/30/2018) 20 capsule 0   No facility-administered medications prior to visit.     No Known Allergies  ROS Review of Systems  Constitutional: Positive for fatigue. Negative for appetite change, chills and fever.  HENT: Negative for congestion, dental problem, ear pain and sore throat.   Eyes: Negative for discharge, redness and visual disturbance.  Respiratory: Negative for cough, chest  tightness, shortness of breath and wheezing.   Cardiovascular: Negative for chest pain, palpitations and leg swelling.  Gastrointestinal: Positive for constipation. Negative for abdominal pain, blood in stool, diarrhea, nausea and vomiting.  Endocrine: Positive for cold intolerance.  Genitourinary: Negative for difficulty urinating, dysuria, flank pain, frequency, hematuria and urgency.  Musculoskeletal: Negative for arthralgias, back pain, joint swelling, myalgias and neck stiffness.  Skin: Negative for pallor and rash.  Neurological: Negative for dizziness, speech difficulty, weakness and headaches.  Hematological: Negative for adenopathy. Does not bruise/bleed easily.  Psychiatric/Behavioral: Negative for confusion and sleep disturbance. The patient is not nervous/anxious.     PE; Blood pressure 115/71, pulse (!) 53, temperature (!) 97.5 F (36.4 C), temperature source Oral, resp. rate 16, height 6\' 1"  (1.854 m), weight 207 lb 4 oz (94 kg), SpO2 100 %. Body mass index is 27.34 kg/m.  Gen: Alert, well appearing.  Patient is oriented to person, place, time, and situation. AFFECT: pleasant, lucid thought and speech. ENT: Ears: EACs clear, normal epithelium.  TMs with good light reflex and landmarks bilaterally.  Eyes: no injection, icteris, swelling, or exudate.  EOMI, PERRLA. Nose: no drainage or turbinate edema/swelling.  No injection or focal lesion.  Mouth: lips without lesion/swelling.  Oral mucosa pink and moist.  Dentition intact and without obvious caries or gingival swelling.  Oropharynx without erythema, exudate, or swelling.  Neck: supple/nontender.  No LAD, mass, or TM.  Carotid pulses 2+ bilaterally, without bruits. CV: RRR, no m/r/g.   LUNGS: CTA bilat, nonlabored resps, good aeration in all lung fields. ABD: soft, NT, ND, BS normal.  No hepatospenomegaly or mass.  No bruits. EXT: no clubbing, cyanosis, or edema.  Musculoskeletal: no joint swelling, erythema, warmth, or  tenderness.  ROM of all joints intact. Skin - no sores or suspicious lesions or rashes or color changes   Pertinent labs:   Lab Results  Component Value Date   TSH <0.01 (L) 01/31/2018   T3TOTAL 147 01/31/2018    Lab Results  Component Value Date   WBC 5.5 01/29/2018   HGB 13.2 01/29/2018   HCT 38.2 (L) 01/29/2018   MCV 85.0 01/29/2018   PLT 237.0 01/29/2018   Lab Results  Component Value Date   CREATININE 0.86 01/29/2018   BUN 17 01/29/2018   NA 142 01/29/2018   K 4.2 01/29/2018   CL 103 01/29/2018   CO2 30 01/29/2018   Lab Results  Component Value Date   ALT 16 01/29/2018   AST 17 01/29/2018  ALKPHOS 82 01/29/2018   BILITOT 0.6 01/29/2018   Lab Results  Component Value Date   CHOL 184 01/19/2017   Lab Results  Component Value Date   HDL 40.80 01/19/2017   Lab Results  Component Value Date   LDLCALC 119 (H) 01/19/2017   Lab Results  Component Value Date   TRIG 117.0 01/19/2017   Lab Results  Component Value Date   CHOLHDL 5 01/19/2017     ASSESSMENT AND PLAN:   1) Subacute thyroiditis: recheck TSH, T4, T3 today.  If TSH not improved and T4/T4 still lower limit of normal or a bit low, pt is going to pursue starting thyroid supplement with Dr. Morrison Old.  2) Health maintenance exam: Reviewed age and gender appropriate health maintenance issues (prudent diet, regular exercise, health risks of tobacco and excessive alcohol, use of seatbelts, fire alarms in home, use of sunscreen).  Also reviewed age and gender appropriate health screening as well as vaccine recommendations. Vaccines: flu vaccine--> given today. Labs: CBC, CMET UTD.  Thyroid labs as stated in #1 above.  Will do FLP today. Prostate ca screening: average risk patient= as per latest guidelines, start screening at 4 yrs of age. Colon ca screening: average risk patient= as per latest guidelines, start screening at 45-50 yrs of age.  An After Visit Summary was printed and given to the  patient.  FOLLOW UP:  Return in about 1 year (around 05/31/2019) for annual CPE (fasting).  Signed:  Santiago Bumpers, MD           05/30/2018

## 2018-05-31 ENCOUNTER — Encounter: Payer: Self-pay | Admitting: *Deleted

## 2018-05-31 ENCOUNTER — Encounter: Payer: Self-pay | Admitting: Family Medicine

## 2018-05-31 LAB — T3: T3, Total: 94 ng/dL (ref 76–181)

## 2018-08-06 DIAGNOSIS — E061 Subacute thyroiditis: Secondary | ICD-10-CM | POA: Diagnosis not present

## 2019-07-11 ENCOUNTER — Encounter: Payer: Self-pay | Admitting: Family Medicine

## 2019-07-11 ENCOUNTER — Telehealth: Payer: Self-pay | Admitting: Family Medicine

## 2019-07-11 ENCOUNTER — Ambulatory Visit (INDEPENDENT_AMBULATORY_CARE_PROVIDER_SITE_OTHER): Payer: 59 | Admitting: Family Medicine

## 2019-07-11 ENCOUNTER — Other Ambulatory Visit: Payer: Self-pay

## 2019-07-11 VITALS — Temp 98.1°F | Ht 73.0 in | Wt 205.0 lb

## 2019-07-11 DIAGNOSIS — H9202 Otalgia, left ear: Secondary | ICD-10-CM | POA: Diagnosis not present

## 2019-07-11 MED ORDER — CEFUROXIME AXETIL 500 MG PO TABS: 500.0000 mg | ORAL_TABLET | Freq: Two times a day (BID) | ORAL | 0 refills | Status: DC

## 2019-07-11 MED ORDER — OFLOXACIN 0.3 % OT SOLN: 10.0000 [drp] | Freq: Every day | OTIC | 0 refills | Status: DC

## 2019-07-11 NOTE — Telephone Encounter (Signed)
Patient has been scheduled at [facility] location.

## 2019-07-11 NOTE — Progress Notes (Signed)
Virtual Visit via Video Note  I connected with Edwin Sparks on 07/11/19 at 11:20 AM EST by a video enabled telemedicine application and verified that I am speaking with the correct person using two identifiers.  Location: Patient: home alone Provider: office    I discussed the limitations of evaluation and management by telemedicine and the availability of in person appointments. The patient expressed understanding and agreed to proceed.  History of Present Illness: L ear pain  x several days.  He has been using neomycin drops that he had from Feb and they ran out and L ear is no better  R ear did resolve No other symptoms  Pt states he usually need oral abx at this point if no better after a few days Observations/Objective: Vitals:   07/11/19 1120  Temp: 98.1 F (36.7 C)     Pt is in NAD Assessment and Plan: 1. Otalgia, left Oral abx and drops Call back if no better  - ofloxacin (FLOXIN) 0.3 % OTIC solution; Place 10 drops into the left ear daily.  Dispense: 10 mL; Refill: 0 - cefUROXime (CEFTIN) 500 MG tablet; Take 1 tablet (500 mg total) by mouth 2 (two) times daily with a meal.  Dispense: 20 tablet; Refill: 0   Follow Up Instructions:    I discussed the assessment and treatment plan with the patient. The patient was provided an opportunity to ask questions and all were answered. The patient agreed with the plan and demonstrated an understanding of the instructions.   The patient was advised to call back or seek an in-person evaluation if the symptoms worsen or if the condition fails to improve as anticipated.  I provided 15 minutes of non-face-to-face time during this encounter.   Ann Held, DO

## 2019-07-11 NOTE — Telephone Encounter (Signed)
Patient has an L ear infection. He states it aches especially when he chews. He has been using Neomycin-Poly Myxin-HC drops 3 times a day. He states it "calms down" when he uses the drops but its not any better. Patient requesting a virtual visit today. I've sent a message to the [facility] office checking for availability.

## 2020-03-22 DIAGNOSIS — U071 COVID-19: Secondary | ICD-10-CM

## 2020-03-22 HISTORY — DX: COVID-19: U07.1

## 2020-04-01 ENCOUNTER — Encounter: Payer: 59 | Admitting: Family Medicine

## 2020-04-22 ENCOUNTER — Encounter: Payer: Self-pay | Admitting: Family Medicine

## 2020-04-22 ENCOUNTER — Ambulatory Visit (INDEPENDENT_AMBULATORY_CARE_PROVIDER_SITE_OTHER): Payer: 59 | Admitting: Family Medicine

## 2020-04-22 ENCOUNTER — Other Ambulatory Visit: Payer: Self-pay

## 2020-04-22 ENCOUNTER — Encounter: Payer: Self-pay | Admitting: Gastroenterology

## 2020-04-22 ENCOUNTER — Telehealth: Payer: Self-pay | Admitting: Gastroenterology

## 2020-04-22 VITALS — BP 116/78 | HR 66 | Temp 97.9°F | Resp 16 | Ht 73.03 in | Wt 213.6 lb

## 2020-04-22 DIAGNOSIS — Z1211 Encounter for screening for malignant neoplasm of colon: Secondary | ICD-10-CM

## 2020-04-22 DIAGNOSIS — Z8639 Personal history of other endocrine, nutritional and metabolic disease: Secondary | ICD-10-CM | POA: Diagnosis not present

## 2020-04-22 DIAGNOSIS — Z0000 Encounter for general adult medical examination without abnormal findings: Secondary | ICD-10-CM

## 2020-04-22 DIAGNOSIS — R194 Change in bowel habit: Secondary | ICD-10-CM | POA: Diagnosis not present

## 2020-04-22 DIAGNOSIS — Z8719 Personal history of other diseases of the digestive system: Secondary | ICD-10-CM

## 2020-04-22 DIAGNOSIS — R195 Other fecal abnormalities: Secondary | ICD-10-CM

## 2020-04-22 DIAGNOSIS — E663 Overweight: Secondary | ICD-10-CM | POA: Diagnosis not present

## 2020-04-22 NOTE — Progress Notes (Signed)
Office Note 04/22/2020  CC:  Chief Complaint  Patient presents with  . Annual Exam    HPI:  Edwin Sparks is a 44 y.o. White male who is here for annual health maintenance exam.  Had covid illness 8/6.  Has now been symptom-free for 2 wks.  Struggles with "mental" energy.  Describes decision fatigue.  Caffeine helps but he doesn't like the quick crash he gets when this wears off.  Sleep is fine some nights, other nights wakes up and can't go back to sleep for a couple hours.  Exercising: jogs 1.5 miler 2-3 times per week, light wts other days.   Does not snore.  Diet is well rounded. Resveratrol.    Stools a bit looser consistently for approx 10 months, w/out abd pain.  No pus or melena.  Some BRBPR.  Gets some gas, burping, gas-ex helps.  No urgent BMs.  Has sensation of incomplete colon emptying.  Has 1-2 BMs per day.  Doesn't take stool softener or laxative.  No fiber supplement. No unusual wt loss.  No FH of known GI probs.   +Hx of unexplained SBO 2015.    Past Medical History:  Diagnosis Date  . H/O vasectomy 2015  . Small bowel obstruction (HCC) 06/2014   No surgery necessary  . 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  F/u thyroid testing has shown TSH gradually normalizing and T4 and T3 levels either a tiny bit low or normal (normal 05/30/09).      Past Surgical History:  Procedure Laterality Date  . LASIK  2003    Family History  Problem Relation Age of Onset  . Lymphoma Father 42       NHL; died age 49    Social History   Socioeconomic History  . Marital status: Married    Spouse name: Not on file  . Number of children: Not on file  . Years of education: Not on file  . Highest education level: Not on file  Occupational History  . Not on file  Tobacco Use  . Smoking status: Former Smoker    Quit date: 08/22/2001     Years since quitting: 18.6  . Smokeless tobacco: Never Used  Vaping Use  . Vaping Use: Never used  Substance and Sexual Activity  . Alcohol use: Yes    Comment: occ  . Drug use: No  . Sexual activity: Not on file  Other Topics Concern  . Not on file  Social History Narrative   Married, has one son and one daughter.   Relocated to [facility] 2006.     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   Lives in [place].     No Tob, 1 glass of wine with dinner.  No drugs.   Exercises 4 d/week--cardio and wt lifting.   Social Determinants of Health   Financial Resource Strain:   . Difficulty of Paying Living Expenses: Not on file  Food Insecurity:   . Worried About Programme researcher, broadcasting/film/video in the Last Year: Not on file  . Ran Out of Food in the Last Year: Not on file  Transportation Needs:   . Lack of Transportation (Medical): Not on file  . Lack of Transportation (Non-Medical): Not on file  Physical Activity:   . Days of Exercise per Week: Not on file  . Minutes of Exercise per Session:  Not on file  Stress:   . Feeling of Stress : Not on file  Social Connections:   . Frequency of Communication with Friends and Family: Not on file  . Frequency of Social Gatherings with Friends and Family: Not on file  . Attends Religious Services: Not on file  . Active Member of Clubs or Organizations: Not on file  . Attends Banker Meetings: Not on file  . Marital Status: Not on file  Intimate Partner Violence:   . Fear of Current or Ex-Partner: Not on file  . Emotionally Abused: Not on file  . Physically Abused: Not on file  . Sexually Abused: Not on file    Outpatient Medications Prior to Visit  Medication Sig Dispense Refill  . Cholecalciferol (VITAMIN D3) 125 MCG (5000 UT) TABS Take by mouth daily.    . Omega-3 Fatty Acids (FISH OIL BURP-LESS PO) Take 1 capsule by mouth daily.    . Zinc 30 MG CAPS Take by mouth.    . ALPRAZolam (XANAX) 0.5 MG tablet 1 tab po  qhs prn insomnia (Patient not taking: Reported on 04/22/2020) 30 tablet 3  . cefUROXime (CEFTIN) 500 MG tablet Take 1 tablet (500 mg total) by mouth 2 (two) times daily with a meal. (Patient not taking: Reported on 04/22/2020) 20 tablet 0  . ofloxacin (FLOXIN) 0.3 % OTIC solution Place 10 drops into the left ear daily. (Patient not taking: Reported on 04/22/2020) 10 mL 0  . Probiotic Product (PROBIOTIC PO) Take 1 capsule by mouth daily. (Patient not taking: Reported on 04/22/2020)     No facility-administered medications prior to visit.    No Known Allergies  ROS Review of Systems  Constitutional: Positive for fatigue (mental more than physical). Negative for appetite change, chills and fever.  HENT: Negative for congestion, dental problem, ear pain and sore throat.   Eyes: Negative for discharge, redness and visual disturbance.  Respiratory: Negative for cough, chest tightness, shortness of breath and wheezing.   Cardiovascular: Negative for chest pain, palpitations and leg swelling.  Gastrointestinal: Positive for diarrhea (loose stool, not watery diarrhea). Negative for abdominal pain, blood in stool, nausea and vomiting.  Endocrine: Negative for cold intolerance, heat intolerance, polydipsia, polyphagia and polyuria.  Genitourinary: Negative for difficulty urinating, dysuria, flank pain, frequency, hematuria and urgency.  Musculoskeletal: Negative for arthralgias, back pain, joint swelling, myalgias and neck stiffness.  Skin: Negative for pallor and rash.  Neurological: Negative for dizziness, speech difficulty, weakness and headaches.  Hematological: Negative for adenopathy. Does not bruise/bleed easily.  Psychiatric/Behavioral: Negative for confusion, dysphoric mood and sleep disturbance. The patient is not nervous/anxious.     PE; Blood pressure 116/78, pulse 66, temperature 97.9 F (36.6 C), temperature source Oral, resp. rate 16, height 6' 1.03" (1.855 m), weight 213 lb 9.6 oz (96.9  kg), SpO2 100 %. Gen: Alert, well appearing.  Patient is oriented to person, place, time, and situation. AFFECT: pleasant, lucid thought and speech. ENT: Ears: EACs clear, normal epithelium.  TMs with good light reflex and landmarks bilaterally.  Eyes: no injection, icteris, swelling, or exudate.  EOMI, PERRLA. Nose: no drainage or turbinate edema/swelling.  No injection or focal lesion.  Mouth: lips without lesion/swelling.  Oral mucosa pink and moist.  Dentition intact and without obvious caries or gingival swelling.  Oropharynx without erythema, exudate, or swelling.  Neck: supple/nontender.  No LAD, mass, or TM.  Carotid pulses 2+ bilaterally, without bruits. CV: RRR, no m/r/g.   LUNGS: CTA bilat, nonlabored  resps, good aeration in all lung fields. ABD: soft, NT, ND, BS normal.  No hepatospenomegaly or mass.  No bruits. EXT: no clubbing, cyanosis, or edema.  Musculoskeletal: no joint swelling, erythema, warmth, or tenderness.  ROM of all joints intact. Skin - no sores or suspicious lesions or rashes or color changes   Pertinent labs:  Lab Results  Component Value Date   TSH 4.77 (H) 05/30/2018   Lab Results  Component Value Date   WBC 5.5 01/29/2018   HGB 13.2 01/29/2018   HCT 38.2 (L) 01/29/2018   MCV 85.0 01/29/2018   PLT 237.0 01/29/2018   Lab Results  Component Value Date   CREATININE 0.86 01/29/2018   BUN 17 01/29/2018   NA 142 01/29/2018   K 4.2 01/29/2018   CL 103 01/29/2018   CO2 30 01/29/2018   Lab Results  Component Value Date   ALT 16 01/29/2018   AST 17 01/29/2018   ALKPHOS 82 01/29/2018   BILITOT 0.6 01/29/2018   Lab Results  Component Value Date   CHOL 203 (H) 05/30/2018   Lab Results  Component Value Date   HDL 42.50 05/30/2018   Lab Results  Component Value Date   LDLCALC 131 (H) 05/30/2018   Lab Results  Component Value Date   TRIG 149.0 05/30/2018   Lab Results  Component Value Date   CHOLHDL 5 05/30/2018   ASSESSMENT AND PLAN:    1) Change in bowel habits: about 10 mo consistently loose BMs, sensation of incomplete colon emptying.  No red flags. Will check celiac labs, CBC, CMET. With this problem PLUS his history of unexplained SBO a few years back I will ask GI to see him.  2) Health maintenance exam: Reviewed age and gender appropriate health maintenance issues (prudent diet, regular exercise, health risks of tobacco and excessive alcohol, use of seatbelts, fire alarms in home, use of sunscreen).  Also reviewed age and gender appropriate health screening as well as vaccine recommendations. Vaccines: Tdap UTD.  Covid 19->UTD.  Flu->declined. Labs: fasting HP labs ordered as well as free T4 and T3 total (pt with hx of thyroiditis). Prostate ca screening: average risk patient= as per latest guidelines, start screening at 61 yrs of age. Colon ca screening: average risk patient= as per latest guidelines, start screening at 45 yrs--->already referred to GI today for further eval of his change in bowel habits + history of unexplained SBO.  An After Visit Summary was printed and given to the patient.  FOLLOW UP:  No follow-ups on file.  Signed:  Santiago Bumpers, MD           04/22/2020

## 2020-04-22 NOTE — Telephone Encounter (Signed)
error 

## 2020-04-23 LAB — CBC WITH DIFFERENTIAL/PLATELET
Absolute Monocytes: 689 cells/uL (ref 200–950)
Basophils Absolute: 21 cells/uL (ref 0–200)
Basophils Relative: 0.4 %
Eosinophils Absolute: 80 cells/uL (ref 15–500)
Eosinophils Relative: 1.5 %
HCT: 42.3 % (ref 38.5–50.0)
Hemoglobin: 14.5 g/dL (ref 13.2–17.1)
Lymphs Abs: 1283 cells/uL (ref 850–3900)
MCH: 30.8 pg (ref 27.0–33.0)
MCHC: 34.3 g/dL (ref 32.0–36.0)
MCV: 89.8 fL (ref 80.0–100.0)
MPV: 12.4 fL (ref 7.5–12.5)
Monocytes Relative: 13 %
Neutro Abs: 3228 cells/uL (ref 1500–7800)
Neutrophils Relative %: 60.9 %
Platelets: 186 10*3/uL (ref 140–400)
RBC: 4.71 10*6/uL (ref 4.20–5.80)
RDW: 13.3 % (ref 11.0–15.0)
Total Lymphocyte: 24.2 %
WBC: 5.3 10*3/uL (ref 3.8–10.8)

## 2020-04-23 LAB — COMPREHENSIVE METABOLIC PANEL
AG Ratio: 2.1 (calc) (ref 1.0–2.5)
ALT: 29 U/L (ref 9–46)
AST: 23 U/L (ref 10–40)
Albumin: 5 g/dL (ref 3.6–5.1)
Alkaline phosphatase (APISO): 68 U/L (ref 36–130)
BUN: 12 mg/dL (ref 7–25)
CO2: 25 mmol/L (ref 20–32)
Calcium: 9.9 mg/dL (ref 8.6–10.3)
Chloride: 103 mmol/L (ref 98–110)
Creat: 1.11 mg/dL (ref 0.60–1.35)
Globulin: 2.4 g/dL (calc) (ref 1.9–3.7)
Glucose, Bld: 81 mg/dL (ref 65–99)
Potassium: 4.3 mmol/L (ref 3.5–5.3)
Sodium: 141 mmol/L (ref 135–146)
Total Bilirubin: 0.7 mg/dL (ref 0.2–1.2)
Total Protein: 7.4 g/dL (ref 6.1–8.1)

## 2020-04-23 LAB — IGA: Immunoglobulin A: 206 mg/dL (ref 47–310)

## 2020-04-23 LAB — LIPID PANEL
Cholesterol: 200 mg/dL — ABNORMAL HIGH (ref <200)
HDL: 42 mg/dL (ref >=40)
LDL Cholesterol (Calc): 126 mg/dL (calc) — ABNORMAL HIGH
Non-HDL Cholesterol (Calc): 158 mg/dL (calc) — ABNORMAL HIGH (ref <130)
Total CHOL/HDL Ratio: 4.8 (calc) (ref <5.0)
Triglycerides: 202 mg/dL — ABNORMAL HIGH (ref <150)

## 2020-04-23 LAB — T4, FREE: Free T4: 1 ng/dL (ref 0.8–1.8)

## 2020-04-23 LAB — TSH: TSH: 2.98 mIU/L (ref 0.40–4.50)

## 2020-04-23 LAB — TISSUE TRANSGLUTAMINASE, IGA: (tTG) Ab, IgA: 1 U/mL

## 2020-04-23 LAB — T3: T3, Total: 97 ng/dL (ref 76–181)

## 2020-04-24 ENCOUNTER — Encounter: Payer: Self-pay | Admitting: Family Medicine

## 2020-04-24 DIAGNOSIS — R41840 Attention and concentration deficit: Secondary | ICD-10-CM

## 2020-04-24 NOTE — Telephone Encounter (Signed)
OK, I entered referral order to [facility] in [place].

## 2020-04-24 NOTE — Telephone Encounter (Signed)
Patient was last seen on 04/22/20 for CPE. Please advise if appt needed prior to referral?

## 2020-05-22 DIAGNOSIS — Z8619 Personal history of other infectious and parasitic diseases: Secondary | ICD-10-CM

## 2020-05-22 HISTORY — DX: Personal history of other infectious and parasitic diseases: Z86.19

## 2020-06-01 ENCOUNTER — Encounter: Payer: Self-pay | Admitting: Family Medicine

## 2020-06-01 ENCOUNTER — Ambulatory Visit (INDEPENDENT_AMBULATORY_CARE_PROVIDER_SITE_OTHER): Payer: 59 | Admitting: Family Medicine

## 2020-06-01 ENCOUNTER — Other Ambulatory Visit: Payer: Self-pay

## 2020-06-01 VITALS — BP 119/74 | HR 56 | Temp 97.6°F | Resp 16 | Ht 73.0 in | Wt 216.0 lb

## 2020-06-01 DIAGNOSIS — B029 Zoster without complications: Secondary | ICD-10-CM

## 2020-06-01 NOTE — Progress Notes (Signed)
OFFICE VISIT  06/01/2020  CC:  Chief Complaint  Patient presents with  . Rash    lower left side of back x 1.5-2 weeks ago, tried hydrocortisone cream for itching and minor relief,   HPI:    Patient is a 44 y.o. Caucasian male who presents for rash. Onset approx 10-14d ago. L side of LB, really itchy and irritating.  Started cortisone cream. Itchy and sensitive to touch but not bad pain. Then wife said it looked like shingles. The discomfort, itchy sensation is also in LUQ but no rash other than L LB/flank.   Past Medical History:  Diagnosis Date  . H/O vasectomy 2015  . Small bowel obstruction (HCC) 06/2014   No surgery necessary  . 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  F/u thyroid testing has shown TSH gradually normalizing and T4 and T3 levels either a tiny bit low or normal (normal 05/30/09).      Past Surgical History:  Procedure Laterality Date  . LASIK  2003    Outpatient Medications Prior to Visit  Medication Sig Dispense Refill  . Cholecalciferol (VITAMIN D3) 125 MCG (5000 UT) TABS Take by mouth daily.    . Omega-3 Fatty Acids (FISH OIL BURP-LESS PO) Take 1 capsule by mouth daily.    . Zinc 30 MG CAPS Take by mouth.     No facility-administered medications prior to visit.    No Known Allergies  ROS As per HPI  PE: Vitals with BMI 06/01/2020 04/22/2020 07/11/2019  Height 6\' 1"  6' 1.031" 6\' 1"   Weight 216 lbs 213 lbs 10 oz 205 lbs  BMI 28.5 28.16 27.05  Systolic 119 116 -  Diastolic 74 78 -  Pulse 56 66 -     Gen: Alert, well appearing.  Patient is oriented to person, place, time, and situation. L LB/flank with about a fist-sized patch of erythema with about 15-20 small ruptured vesicles, dry.  No streaking.  Sensitive to touch but not bad.  No rash along his side or on abdomen.  LABS:  none  IMPRESSION AND  PLAN:  Left lower back (about T9 level)/flank region: fortunately only mild symptoms. It is in the resolution stage.   No antiviral indicated-->too late. Reassured pt. Discussed the dx, what to watch for, needs shingrix at age 42.  An After Visit Summary was printed and given to the patient.  FOLLOW UP: Return if symptoms worsen or fail to improve.  Signed:  , MD           06/01/2020

## 2020-06-22 ENCOUNTER — Ambulatory Visit (INDEPENDENT_AMBULATORY_CARE_PROVIDER_SITE_OTHER): Payer: 59 | Admitting: Gastroenterology

## 2020-06-22 ENCOUNTER — Encounter: Payer: Self-pay | Admitting: Gastroenterology

## 2020-06-22 VITALS — BP 126/80 | HR 73 | Ht 73.0 in | Wt 216.0 lb

## 2020-06-22 DIAGNOSIS — R140 Abdominal distension (gaseous): Secondary | ICD-10-CM

## 2020-06-22 DIAGNOSIS — R194 Change in bowel habit: Secondary | ICD-10-CM | POA: Diagnosis not present

## 2020-06-22 DIAGNOSIS — K625 Hemorrhage of anus and rectum: Secondary | ICD-10-CM

## 2020-06-22 MED ORDER — PLENVU 140 G PO SOLR: 140.0000 g | ORAL | 0 refills | Status: DC

## 2020-06-22 NOTE — Patient Instructions (Addendum)
If you are age 45 or older, your body mass index should be between 23-30. Your Body mass index is 28.5 kg/m. If this is out of the aforementioned range listed, please consider follow up with your Primary Care Provider.  If you are age 71 or younger, your body mass index should be between 19-25. Your Body mass index is 28.5 kg/m. If this is out of the aformentioned range listed, please consider follow up with your Primary Care Provider.   It was a pleasure to see you today!  Dr. Myrtie Neither   Food Guidelines for those with chronic digestive trouble:  Many people have difficulty digesting certain foods, causing a variety of distressing and embarrassing symptoms such as abdominal pain, bloating and gas.  These foods may need to be avoided or consumed in small amounts.  Here are some tips that might be helpful for you.  1.   Lactose intolerance is the difficulty or complete inability to digest lactose, the natural sugar in milk and anything made from milk.  This condition is harmless, common, and can begin any time during life.  Some people can digest a modest amount of lactose while others cannot tolerate any.  Also, not all dairy products contain equal amounts of lactose.  For example, hard cheeses such as parmesan have less lactose than soft cheeses such as cheddar.  Yogurt has less lactose than milk or cheese.  Many packaged foods (even many brands of bread) have milk, so read ingredient lists carefully.  It is difficult to test for lactose intolerance, so just try avoiding lactose as much as possible for a week and see what happens with your symptoms.  If you seem to be lactose intolerant, the best plan is to avoid it (but make sure you get calcium from another source).  The next best thing is to use lactase enzyme supplements, available over the counter everywhere.  Just know that many lactose intolerant people need to take several tablets with each serving of dairy to avoid symptoms.  Lastly, a lot of  restaurant food is made with milk or butter.  Many are things you might not suspect, such as mashed potatoes, rice and pasta (cooked with butter) and "grilled" items.  If you are lactose intolerant, it never hurts to ask your server what has milk or butter.  2.   Fiber is an important part of your diet, but not all fiber is well-tolerated.  Insoluble fiber such as bran is often consumed by normal gut bacteria and converted into gas.  Soluble fiber such as oats, squash, carrots and green beans are typically tolerated better.  3.   Some types of carbohydrates can be poorly digested.  Examples include: fructose (apples, cherries, pears, raisins and other dried fruits), fructans (onions, zucchini, large amounts of wheat), sorbitol/mannitol/xylitol and sucralose/Splenda (common artificial sweeteners), and raffinose (lentils, broccoli, cabbage, asparagus, brussel sprouts, many types of beans).  Do a Programmer, multimedia for [employer] and you will find helpful information. Beano, a dietary supplement, will often help with raffinose-containing foods.  As with lactase tablets, you may need several per serving.  4.   Whenever possible, avoid processed food&meats and chemical additives.  High fructose corn syrup, a common sweetener, may be difficult to digest.  Eggs and soy (comes from the soybean, and added to many foods now) are other common bloating/gassy foods.  5.  Regarding gluten:  gluten is a protein mainly found in wheat, but also rye and barley.  There is a  condition called celiac sprue, which is an inflammatory reaction in the small intestine causing a variety of digestive symptoms.  Blood testing is highly reliable to look for this condition, and sometimes upper endoscopy with small bowel biopsies may be necessary to make the diagnosis.  Many patients who test negative for celiac sprue report improvement in their digestive symptoms when they switch to a gluten-free diet.  However, in these "non-celiac gluten  sensitive" patients, the true role of gluten in their symptoms is unclear.  Reducing carbohydrates in general may decrease the gas and bloating caused when gut bacteria consume carbs. Also, some of these patients may actually be intolerant of the baker's yeast in bread products rather than the gluten.  Flatbread and other reduced yeast breads might therefore be tolerated.  There is no specific testing available for most food intolerances, which are discovered mainly by dietary elimination.  Please do not embark on a gluten free diet unless directed by your doctor, as it is highly restrictive, and may lead to nutritional deficiencies if not carefully monitored.  Lastly, beware of internet claims offering "personalized" tests for food intolerances.  Such testing has no reliable scientific evidence to support its reliability and correlation to symptoms.    6.  The best advice is old advice, especially for those with chronic digestive trouble - try to eat "clean".  Balanced diet, avoid processed food, plenty of fruits and vegetables, cut down the sugar, minimal alcohol, avoid tobacco. Make time to care for yourself, get enough sleep, exercise when you can, reduce stress.  Your guts will thank you for it.   - Dr. Herma [facility]

## 2020-06-22 NOTE — Progress Notes (Signed)
[facility] Consult Note:  History: Edwin Sparks 06/22/2020  Referring provider: Jeoffrey Massed, MD  Reason for consult/chief complaint: New Patient (Initial Visit) (change in bowel habits. Loose stools,sensation of incomplete colon emptying. SBO 06/2014) and Gas   Subjective  HPI:  This is a very pleasant 44 year old man referred by primary care to see me for about 1 to 2 years of change in bowel habits.  His stools are poorly formed with gas and bloating.  Occasionally he will have some painless rectal bleeding that he attributes to hemorrhoids.  He has noticed increasing gas pressure in the upper abdomen and has to take Gas-X a couple times a week.  Less commonly, he will feel constipated if he gets dehydrated.  Most often after bowel movements he will feeling completely evacuated and as if he needs to go again. Ben denies nausea vomiting loss of appetite or weight loss.  He does not have dysphagia or odynophagia.  ROS:  Review of Systems  Constitutional: Negative for appetite change and unexpected weight change.  HENT: Negative for mouth sores and voice change.   Eyes: Negative for pain and redness.  Respiratory: Negative for cough and shortness of breath.   Cardiovascular: Negative for chest pain and palpitations.  Genitourinary: Negative for dysuria and hematuria.  Musculoskeletal: Negative for arthralgias and myalgias.  Skin: Negative for pallor and rash.  Neurological: Negative for weakness and headaches.  Hematological: Negative for adenopathy.     Past Medical History: Past Medical History:  Diagnosis Date  . H/O vasectomy 2015  . History of shingles 05/2020   L low/mid back  . Small bowel obstruction (HCC) 06/2014   No surgery necessary  . 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  F/u thyroid  testing has shown TSH gradually normalizing and T4 and T3 levels either a tiny bit low or normal (normal 05/30/09).       Past Surgical History: Past Surgical History:  Procedure Laterality Date  . LASIK  2003     Family History: Family History  Problem Relation Age of Onset  . Lymphoma Father 90       NHL; died age 31  . Esophageal cancer Father   . Colon cancer Neg Hx   . Pancreatic cancer Neg Hx    On further questioning, he says his father had "throat cancer", that was actually of lymphoma in his neck.  He did not have esophageal cancer.   Social History: Social History   Socioeconomic History  . Marital status: Married    Spouse name: Not on file  . Number of children: Not on file  . Years of education: Not on file  . Highest education level: Not on file  Occupational History  . Not on file  Tobacco Use  . Smoking status: Former Smoker    Quit date: 08/22/2001    Years since quitting: 18.8  . Smokeless tobacco: Never Used  Vaping Use  . Vaping Use: Never used  Substance and Sexual Activity  . Alcohol use: Yes    Comment: occ  . Drug use: No  . Sexual activity: Not on file  Other Topics Concern  . Not on file  Social History Narrative   Married, has one son and one daughter.   Relocated to [facility] 2006.     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  Lives in [place].     No Tob, 1 glass of wine with dinner.  No drugs.   Exercises 4 d/week--cardio and wt lifting.   Social Determinants of Health   Financial Resource Strain:   . Difficulty of Paying Living Expenses: Not on file  Food Insecurity:   . Worried About Programme researcher, broadcasting/film/video in the Last Year: Not on file  . Ran Out of Food in the Last Year: Not on file  Transportation Needs:   . Lack of Transportation (Medical): Not on file  . Lack of Transportation (Non-Medical): Not on file  Physical Activity:   . Days of Exercise per Week: Not on file  . Minutes of Exercise per Session:  Not on file  Stress:   . Feeling of Stress : Not on file  Social Connections:   . Frequency of Communication with Friends and Family: Not on file  . Frequency of Social Gatherings with Friends and Family: Not on file  . Attends Religious Services: Not on file  . Active Member of Clubs or Organizations: Not on file  . Attends Banker Meetings: Not on file  . Marital Status: Not on file    Allergies: No Known Allergies  Outpatient Meds: Current Outpatient Medications  Medication Sig Dispense Refill  . Cholecalciferol (VITAMIN D3) 125 MCG (5000 UT) TABS Take by mouth daily.    . Omega-3 Fatty Acids (FISH OIL BURP-LESS PO) Take 1 capsule by mouth daily.    . Zinc 30 MG CAPS Take by mouth.    Marland Kitchen PEG-KCl-NaCl-NaSulf-Na Asc-C (PLENVU) 140 g SOLR Take 140 g by mouth as directed. Manufacturer's coupon Universal coupon code:BIN: G6837245; GROUP: [id number]; PCN: [id number]; ID: [id number]; PAY NO MORE $50 1 each 0   No current facility-administered medications for this visit.      ___________________________________________________________________ Objective   Exam:  BP 126/80   Pulse 73   Ht 6\' 1"  (1.854 m)   Wt 216 lb (98 kg)   SpO2 97%   BMI 28.50 kg/m    General: Well-appearing  Eyes: sclera anicteric, no redness  ENT: oral mucosa moist without lesions, no cervical or supraclavicular lymphadenopathy  CV: RRR without murmur, S1/S2, no JVD, no peripheral edema  Resp: clear to auscultation bilaterally, normal RR and effort noted  GI: soft, no tenderness, with active bowel sounds. No guarding or palpable organomegaly noted.  Skin; warm and dry, no rash or jaundice noted  Neuro: awake, alert and oriented x 3. Normal gross motor function and fluent speech  Labs:  CBC Latest Ref Rng & Units 04/22/2020 01/29/2018 01/19/2017  WBC 3.8 - 10.8 Thousand/uL 5.3 5.5 4.5  Hemoglobin 13.2 - 17.1 g/dL 01/21/2017 80.9 98.3  Hematocrit 38 - 50 % 42.3 38.2(L) 44.6  Platelets 140 - 400  Thousand/uL 186 237.0 155.0   CMP Latest Ref Rng & Units 04/22/2020 01/29/2018 01/19/2017  Glucose 65 - 99 mg/dL 81 91 80  BUN 7 - 25 mg/dL 12 17 19   Creatinine 0.60 - 1.35 mg/dL 01/21/2017 5.05  Sodium 135 - 146 mmol/L 141 142 140  Potassium 3.5 - 5.3 mmol/L 4.3 4.2 4.1  Chloride 98 - 110 mmol/L 103 103 105  CO2 20 - 32 mmol/L 25 30 30   Calcium 8.6 - 10.3 mg/dL 9.9 9.9 9.8  Total Protein 6.1 - 8.1 g/dL 7.4 7.2 7.3  Total Bilirubin 0.2 - 1.2 mg/dL 0.7 0.6 1.0  Alkaline Phos 39 - 117 U/L - 82 59  AST 10 -  40 U/L 23 17 17   ALT 9 - 46 U/L 29 16 12    Lab Results  Component Value Date   TSH 2.98 04/22/2020   ttg and iga nml   Assessment: Encounter Diagnoses  Name Primary?  . Change in bowel habits Yes  . Rectal bleeding   . Abdominal bloating     At least a year of change in bowel habits with episodic rectal bleeding, chronic bloating and gas.  Possible dietary trigger, and some written dietary guidelines were given.  Consider IBD, colonoscopy scheduled.  Neoplasia less likely.  Plan:  Colonoscopy  The benefits and risks of the planned procedure were described in detail with the patient or (when appropriate) their health care proxy.  Risks were outlined as including, but not limited to, bleeding, infection, perforation, adverse medication reaction leading to cardiac or pulmonary decompensation, pancreatitis (if ERCP).  The limitation of incomplete mucosal visualization was also discussed.  No guarantees or warranties were given.   Thank you for the courtesy of this consult.  Please call me with any questions or concerns.  III  CC: Referring provider noted above

## 2020-07-29 ENCOUNTER — Ambulatory Visit (AMBULATORY_SURGERY_CENTER): Payer: 59 | Admitting: Gastroenterology

## 2020-07-29 ENCOUNTER — Encounter: Payer: Self-pay | Admitting: Gastroenterology

## 2020-07-29 ENCOUNTER — Other Ambulatory Visit: Payer: Self-pay

## 2020-07-29 VITALS — BP 119/75 | HR 64 | Temp 97.9°F | Resp 12 | Ht 73.0 in | Wt 216.0 lb

## 2020-07-29 DIAGNOSIS — R194 Change in bowel habit: Secondary | ICD-10-CM

## 2020-07-29 DIAGNOSIS — R140 Abdominal distension (gaseous): Secondary | ICD-10-CM

## 2020-07-29 DIAGNOSIS — K648 Other hemorrhoids: Secondary | ICD-10-CM | POA: Diagnosis not present

## 2020-07-29 DIAGNOSIS — K625 Hemorrhage of anus and rectum: Secondary | ICD-10-CM | POA: Diagnosis not present

## 2020-07-29 HISTORY — PX: COLONOSCOPY: SHX174

## 2020-07-29 MED ORDER — SODIUM CHLORIDE 0.9 % IV SOLN: 500.0000 mL | Freq: Once | INTRAVENOUS | Status: DC

## 2020-07-29 NOTE — Patient Instructions (Addendum)
_______________________________________________________ Food Guidelines for those with chronic digestive trouble:  Many people have difficulty digesting certain foods, causing a variety of distressing and embarrassing symptoms such as abdominal pain, bloating and gas.  These foods may need to be avoided or consumed in small amounts.  Here are some tips that might be helpful for you.  1.   Lactose intolerance is the difficulty or complete inability to digest lactose, the natural sugar in milk and anything made from milk.  This condition is harmless, common, and can begin any time during life.  Some people can digest a modest amount of lactose while others cannot tolerate any.  Also, not all dairy products contain equal amounts of lactose.  For example, hard cheeses such as parmesan have less lactose than soft cheeses such as cheddar.  Yogurt has less lactose than milk or cheese.  Many packaged foods (even many brands of bread) have milk, so read ingredient lists carefully.  It is difficult to test for lactose intolerance, so just try avoiding lactose as much as possible for a week and see what happens with your symptoms.  If you seem to be lactose intolerant, the best plan is to avoid it (but make sure you get calcium from another source).  The next best thing is to use lactase enzyme supplements, available over the counter everywhere.  Just know that many lactose intolerant people need to take several tablets with each serving of dairy to avoid symptoms.  Lastly, a lot of restaurant food is made with milk or butter.  Many are things you might not suspect, such as mashed potatoes, rice and pasta (cooked with butter) and "grilled" items.  If you are lactose intolerant, it never hurts to ask your server what has milk or butter.  2.   Fiber is an important part of your diet, but not all fiber is well-tolerated.  Insoluble fiber such as bran is often consumed by normal gut bacteria and converted into gas.  Soluble  fiber such as oats, squash, carrots and green beans are typically tolerated better.  3.   Some types of carbohydrates can be poorly digested.  Examples include: fructose (apples, cherries, pears, raisins and other dried fruits), fructans (onions, zucchini, large amounts of wheat), sorbitol/mannitol/xylitol and sucralose/Splenda (common artificial sweeteners), and raffinose (lentils, broccoli, cabbage, asparagus, brussel sprouts, many types of beans).  Do a web search for FODMAP diet and you will find helpful information. Beano, a dietary supplement, will often help with raffinose-containing foods.  As with lactase tablets, you may need several per serving.  4.   Whenever possible, avoid processed food&meats and chemical additives.  High fructose corn syrup, a common sweetener, may be difficult to digest.  Eggs and soy (comes from the soybean, and added to many foods now) are other common bloating/gassy foods.  5.  Regarding gluten:  gluten is a protein mainly found in wheat, but also rye and barley.  There is a condition called celiac sprue, which is an inflammatory reaction in the small intestine causing a variety of digestive symptoms.  Blood testing is highly reliable to look for this condition, and sometimes upper endoscopy with small bowel biopsies may be necessary to make the diagnosis.  Many patients who test negative for celiac sprue report improvement in their digestive symptoms when they switch to a gluten-free diet.  However, in these "non-celiac gluten sensitive" patients, the true role of gluten in their symptoms is unclear.  Reducing carbohydrates in general may decrease the gas and   caused when gut bacteria consume carbs. Also, some of these patients may actually be intolerant of the baker's yeast in bread products rather than the gluten.  Flatbread and other reduced yeast breads might therefore be tolerated.  There is no specific testing available for most food intolerances, which are discovered mainly by dietary elimination.  Please do  not embark on a gluten free diet unless directed by your doctor, as it is highly restrictive, and may lead to nutritional deficiencies if not carefully monitored.  Lastly, beware of internet claims offering "personalized" tests for food intolerances.  Such testing has no reliable scientific evidence to support its reliability and correlation to symptoms.    6.  The best advice is old advice, especially for those with chronic digestive trouble - try to eat "clean".  Balanced diet, avoid processed food, plenty of fruits and vegetables, cut down the sugar, minimal alcohol, avoid tobacco. Make time to care for yourself, get enough sleep, exercise when you can, reduce stress.  Your guts will thank you for it.   - Dr[facility] _____________________________________________________ Handout given: Hemorrhiods, banding Resume previous diet Continue current medications Await pathology results Repeat colonoscopy in 10 years !!!!!   YOU HAD AN ENDOSCOPIC PROCEDURE TODAY AT THE [facility]:   Refer to the procedure report that was given to you for any specific questions about what was found during the examination.  If the procedure report does not answer your questions, please call your gastroenterologist to clarify.  If you requested that your care partner not be given the details of your procedure findings, then the procedure report has been included in a sealed envelope for you to review at your convenience later.  YOU SHOULD EXPECT: Some feelings of bloating in the abdomen. Passage of more gas than usual.  Walking can help get rid of the air that was put into your GI tract during the procedure and reduce the bloating. If you had a lower endoscopy (such as a colonoscopy or flexible sigmoidoscopy) you may notice spotting of blood in your stool or on the toilet paper. If you underwent a bowel prep for your procedure, you may not have a normal bowel movement for a few  days.  Please Note:  You might notice some irritation and congestion in your nose or some drainage.  This is from the oxygen used during your procedure.  There is no need for concern and it should clear up in a day or so.  SYMPTOMS TO REPORT IMMEDIATELY:   Following lower endoscopy (colonoscopy or flexible sigmoidoscopy):  Excessive amounts of blood in the stool  Significant tenderness or worsening of abdominal pains  Swelling of the abdomen that is new, acute  Fever of 100F or higher  For urgent or emergent issues, a gastroenterologist can be reached at any hour by calling [phone]. Do not use MyChart messaging for urgent concerns.   DIET:  We do recommend a small meal at first, but then you may proceed to your regular diet.  Drink plenty of fluids but you should avoid alcoholic beverages for 24 hours.  ACTIVITY:  You should plan to take it easy for the rest of today and you should NOT DRIVE or use heavy machinery until tomorrow (because of the sedation medicines used during the test).    FOLLOW UP: Our staff will call the number listed on your records 48-72 hours following your procedure to check on you and address any questions or concerns that  you may have regarding the information given to you following your procedure. If we do not reach you, we will leave a message.  We will attempt to reach you two times.  During this call, we will ask if you have developed any symptoms of COVID 19. If you develop any symptoms (ie: fever, flu-like symptoms, shortness of breath, cough etc.) before then, please call [phone].  If you test positive for Covid 19 in the 2 weeks post procedure, please call and report this information to [place].    If any biopsies were taken you will be contacted by phone or by letter within the next 1-3 weeks.  Please call us at [phone] if you have not heard about the biopsies in 3 weeks.    SIGNATURES/CONFIDENTIALITY: You and/or your care partner have  signed paperwork which will be entered into your electronic medical record.  These signatures attest to the fact that that the information above on your After Visit Summary has been reviewed and is understood.  Full responsibility of the confidentiality of this discharge information lies with you and/or your care-partner.YOU HAD AN ENDOSCOPIC PROCEDURE TODAY AT THE [facility]:   Refer to the procedure report that was given to you for any specific questions about what was found during the examination.  If the procedure report does not answer your questions, please call your gastroenterologist to clarify.  If you requested that your care partner not be given the details of your procedure findings, then the procedure report has been included in a sealed envelope for you to review at your convenience later.  YOU SHOULD EXPECT: Some feelings of bloating in the abdomen. Passage of more gas than usual.  Walking can help get rid of the air that was put into your GI tract during the procedure and reduce the bloating. If you had a lower endoscopy (such as a colonoscopy or flexible sigmoidoscopy) you may notice spotting of blood in your stool or on the toilet paper. If you underwent a bowel prep for your procedure, you may not have a normal bowel movement for a few days.  Please Note:  You might notice some irritation and congestion in your nose or some drainage.  This is from the oxygen used during your procedure.  There is no need for concern and it should clear up in a day or so.  SYMPTOMS TO REPORT IMMEDIATELY:   Following lower endoscopy (colonoscopy or flexible sigmoidoscopy):  Excessive amounts of blood in the stool  Significant tenderness or worsening of abdominal pains  Swelling of the abdomen that is new, acute  Fever of 100F or higher   Following upper endoscopy (EGD)  Vomiting of blood or coffee ground material  New chest pain or pain under the shoulder blades  Painful or  persistently difficult swallowing  New shortness of breath  Fever of 100F or higher  Black, tarry-looking stools  For urgent or emergent issues, a gastroenterologist can be reached at any hour by calling [phone]. Do not use MyChart messaging for urgent concerns.    DIET:  We do recommend a small meal at first, but then you may proceed to your regular diet.  Drink plenty of fluids but you should avoid alcoholic beverages for 24 hours.  ACTIVITY:  You should plan to take it easy for the rest of today and you should NOT DRIVE or use heavy machinery until tomorrow (because of the sedation medicines used during the test).    FOLLOW UP:  Our staff will call the number listed on your records 48-72 hours following your procedure to check on you and address any questions or concerns that you may have regarding the information given to you following your procedure. If we do not reach you, we will leave a message.  We will attempt to reach you two times.  During this call, we will ask if you have developed any symptoms of COVID 19. If you develop any symptoms (ie: fever, flu-like symptoms, shortness of breath, cough etc.) before then, please call [phone].  If you test positive for Covid 19 in the 2 weeks post procedure, please call and report this information to [place].    If any biopsies were taken you will be contacted by phone or by letter within the next 1-3 weeks.  Please call us at [phone] if you have not heard about the biopsies in 3 weeks.    SIGNATURES/CONFIDENTIALITY: You and/or your care partner have signed paperwork which will be entered into your electronic medical record.  These signatures attest to the fact that that the information above on your After Visit Summary has been reviewed and is understood.  Full responsibility of the confidentiality of this discharge information lies with you and/or your care-partner.

## 2020-07-29 NOTE — Progress Notes (Signed)
Called to room to assist during endoscopic procedure.  Patient ID and intended procedure confirmed with present staff. Received instructions for my participation in the procedure from the performing physician.  

## 2020-07-29 NOTE — Progress Notes (Signed)
VS-CW 

## 2020-07-29 NOTE — Progress Notes (Signed)
PT taken to PACU. Monitors in place. VSS. Report given to RN. 

## 2020-07-29 NOTE — Op Note (Signed)
[facility] Patient Name: Edwin Sparks Procedure Date: 07/29/2020 3:20 PM MRN: [id number] Endoscopist: Sherilyn Cooter L. Myrtie Neither , MD Age: 44 Referring MD:  Date of Birth: [birth year] Gender: Male Account #: [id number] Procedure:                Colonoscopy Indications:              Chronic diarrhea, Rectal bleeding, Bloating and gas Medicines:                Monitored Anesthesia Care Procedure:                Pre-Anesthesia Assessment:                           - Prior to the procedure, a History and Physical                            was performed, and patient medications and                            allergies were reviewed. The patient's tolerance of                            previous anesthesia was also reviewed. The risks                            and benefits of the procedure and the sedation                            options and risks were discussed with the patient.                            All questions were answered, and informed consent                            was obtained. Prior Anticoagulants: The patient has                            taken no previous anticoagulant or antiplatelet                            agents. ASA Grade Assessment: II - A patient with                            mild systemic disease. After reviewing the risks                            and benefits, the patient was deemed in                            satisfactory condition to undergo the procedure.                           After obtaining informed consent, the colonoscope  was passed under direct vision. Throughout the                            procedure, the patient's blood pressure, pulse, and                            oxygen saturations were monitored continuously. The                            Colonoscope was introduced through the anus and                            advanced to the the terminal ileum, with                            identification of  the appendiceal orifice and IC                            valve. The colonoscopy was performed without                            difficulty. The patient tolerated the procedure                            well. The quality of the bowel preparation was                            excellent. The terminal ileum, ileocecal valve,                            appendiceal orifice, and rectum were photographed. Scope In: 3:32:32 PM Scope Out: 3:44:07 PM Scope Withdrawal Time: 0 hours 9 minutes 7 seconds  Total Procedure Duration: 0 hours 11 minutes 35 seconds  Findings:                 The perianal and digital rectal examinations were                            normal.                           The terminal ileum appeared normal.                           Normal mucosa was found in the entire colon.                            Biopsies for histology were taken with a cold                            forceps from the right colon and left colon for                            evaluation of microscopic colitis.  Internal hemorrhoids were found.                           The exam was otherwise without abnormality on                            direct and retroflexion views. Complications:            No immediate complications. Estimated Blood Loss:     Estimated blood loss was minimal. Impression:               - The examined portion of the ileum was normal.                           - Normal mucosa in the entire examined colon.                            Biopsied.                           - Internal hemorrhoids.                           - The examination was otherwise normal on direct                            and retroflexion views.                           If biopsies normal, plan will be testing for celiac                            abtibodies and a trial of anti-spasmodic therapy                            for suspected IBS.                           Written dietary  advice also given with instructions                            today. Recommendation:           - Patient has a contact number available for                            emergencies. The signs and symptoms of potential                            delayed complications were discussed with the                            patient. Return to normal activities tomorrow.                            Written discharge instructions were provided to the  patient.                           - Resume previous diet.                           - Continue present medications.                           - Await pathology results.                           - Repeat colonoscopy in 10 years for screening                            purposes.                           - Return to my office at appointment to be                            scheduled. Huma Imhoff L. Myrtie Neither, MD 07/29/2020 3:50:35 PM This report has been signed electronically.

## 2020-07-31 ENCOUNTER — Telehealth: Payer: Self-pay

## 2020-07-31 NOTE — Telephone Encounter (Signed)
  Follow up Call-  Call back number 07/29/2020  Post procedure Call Back phone  # [phone]  Permission to leave phone message Yes  Some recent data might be hidden     Patient questions:  Do you have a fever, pain , or abdominal swelling? No. Pain Score  0 *  Have you tolerated food without any problems? Yes.    Have you been able to return to your normal activities? Yes.    Do you have any questions about your discharge instructions: Diet   No. Medications  No. Follow up visit  No.  Do you have questions or concerns about your Care? No.  Actions: * If pain score is 4 or above: No action needed, pain <4.  1. Have you developed a fever since your procedure? no  2.   Have you had an respiratory symptoms (SOB or cough) since your procedure? no  3.   Have you tested positive for COVID 19 since your procedure no  4.   Have you had any family members/close contacts diagnosed with the COVID 19 since your procedure?  no   If yes to any of these questions please route to Laverna Peace, RN and Karlton Lemon, RN

## 2020-08-04 ENCOUNTER — Telehealth: Payer: Self-pay | Admitting: Gastroenterology

## 2020-08-04 ENCOUNTER — Other Ambulatory Visit: Payer: Self-pay

## 2020-08-04 DIAGNOSIS — R197 Diarrhea, unspecified: Secondary | ICD-10-CM

## 2020-08-04 MED ORDER — HYOSCYAMINE SULFATE 0.125 MG PO TABS: 0.1250 mg | ORAL_TABLET | Freq: Two times a day (BID) | ORAL | 1 refills | Status: DC | PRN

## 2020-08-04 NOTE — Telephone Encounter (Signed)
Pt is requesting a call back from a nurse regarding his pathology results, pt missed call

## 2020-08-04 NOTE — Telephone Encounter (Signed)
Spoke with patient, see 07/29/20 pathology result note for more information.

## 2020-11-17 ENCOUNTER — Telehealth (INDEPENDENT_AMBULATORY_CARE_PROVIDER_SITE_OTHER): Payer: 59 | Admitting: Family Medicine

## 2020-11-17 ENCOUNTER — Encounter: Payer: Self-pay | Admitting: Family Medicine

## 2020-11-17 DIAGNOSIS — J019 Acute sinusitis, unspecified: Secondary | ICD-10-CM | POA: Diagnosis not present

## 2020-11-17 DIAGNOSIS — B9689 Other specified bacterial agents as the cause of diseases classified elsewhere: Secondary | ICD-10-CM

## 2020-11-17 MED ORDER — DOXYCYCLINE HYCLATE 100 MG PO TABS: 100.0000 mg | ORAL_TABLET | Freq: Two times a day (BID) | ORAL | 0 refills | Status: DC

## 2020-11-17 MED ORDER — FLUTICASONE PROPIONATE 50 MCG/ACT NA SUSP: 2.0000 | Freq: Two times a day (BID) | NASAL | 6 refills | Status: DC

## 2020-11-17 NOTE — Progress Notes (Signed)
VIRTUAL VISIT VIA VIDEO  I connected with Edwin Sparks on 11/17/20 at 11:00 AM EDT by elemedicine application and verified that I am speaking with the correct person using two identifiers. Location patient: Home Location provider: [facility], Office Persons participating in the virtual visit: Patient, Dr. Claiborne Billings and Reece Agar. Cesar, CMA  I discussed the limitations of evaluation and management by telemedicine and the availability of in person appointments. The patient expressed understanding and agreed to proceed.   SUBJECTIVE Chief Complaint  Patient presents with  . Cough    Pt c/o cough, sore throat, ear fullness x 5 days;     [id number] Edwin Sparks is Edwin 45 y.o. male present for acute illness of cough, sore throat and ear fullness of 5 days duration. His daughter had Edwin similar illness Edwin few days ago, but recovered quickly. He had Edwin negative covid test this this morning. He denies fever, headache, myalgia, nausea, vomit or diarrhea. He endorses sinus pressure, ear pain and fullness L>R, hoarseness, sore throat and chills. His cough is worse at night. He has tried benadryl and Nyquil to help w/ his symptoms.   covid vaccine  x2.  ROS: See pertinent positives and negatives per HPI.  Patient Active Problem List   Diagnosis Date Noted  . Bowel obstruction (HCC) 07/02/2014  . Health maintenance examination 09/13/2012  . Epistaxis, recurrent 09/04/2012    Social History   Tobacco Use  . Smoking status: Former Smoker    Quit date: 08/22/2001    Years since quitting: 19.2  . Smokeless tobacco: Never Used  Substance Use Topics  . Alcohol use: Yes    Comment: occ    Current Outpatient Medications:  .  Cholecalciferol (VITAMIN D3) 125 MCG (5000 UT) TABS, Take by mouth daily., Disp: , Rfl:  .  doxycycline (VIBRA-TABS) 100 MG tablet, Take 1 tablet (100 mg total) by mouth 2 (two) times daily., Disp: 20 tablet, Rfl: 0 .  fluticasone (FLONASE) 50 MCG/ACT nasal spray, Place 2  sprays into both nostrils in the morning and at bedtime., Disp: 16 g, Rfl: 6 .  hyoscyamine (LEVSIN) 0.125 MG tablet, Take 1 tablet (0.125 mg total) by mouth 2 (two) times daily as needed (cramping and diarrhea)., Disp: 60 tablet, Rfl: 1 .  Omega-3 Fatty Acids (FISH OIL BURP-LESS PO), Take 1 capsule by mouth daily., Disp: , Rfl:  .  VYVANSE 40 MG capsule, Take 40 mg by mouth every morning., Disp: , Rfl:  .  Zinc 30 MG CAPS, Take by mouth., Disp: , Rfl:  .  ADDERALL XR 20 MG 24 hr capsule, Take 20 mg by mouth daily., Disp: , Rfl:   No Known Allergies  OBJECTIVE: There were no vitals taken for this visit. Gen: No acute distress. Nontoxic in appearance.  HENT: AT. [place].  MMM.  Eyes:Pupils Equal Round Reactive to light, Extraocular movements intact,  Conjunctiva without redness, discharge or icterus. Chest: Cough - present- mild Skin: no rashes, purpura or petechiae.  Neuro:  Normal gait. Alert. Oriented x3  Psych: Normal affect and demeanor. Normal speech. Normal thought content and judgment.  ASSESSMENT AND PLAN: Edwin Sparks is Edwin 45 y.o. male present for  1. Acute bacterial sinusitis Rest, hydrate.  Start mucinex (DM if cough), nettie pot or nasal saline.  Start floanse nasal spray BID doxy prescribed, take until completed.  If cough present it can last up to 6-8 weeks.  F/U 2 weeks if not improved.  Luster Landsberg  Seraphine Gudiel, DO 11/17/2020   Return in about 2 weeks (around 12/01/2020), or if symptoms worsen or fail to improve.  No orders of the defined types were placed in this encounter.  Meds ordered this encounter  Medications  . doxycycline (VIBRA-TABS) 100 MG tablet    Sig: Take 1 tablet (100 mg total) by mouth 2 (two) times daily.    Dispense:  20 tablet    Refill:  0  . fluticasone (FLONASE) 50 MCG/ACT nasal spray    Sig: Place 2 sprays into both nostrils in the morning and at bedtime.    Dispense:  16 g    Refill:  6   Referral Orders  No referral(s) requested  today

## 2020-11-17 NOTE — Patient Instructions (Signed)

## 2021-05-17 ENCOUNTER — Encounter: Payer: 59 | Admitting: Family Medicine

## 2021-05-25 ENCOUNTER — Encounter: Payer: Self-pay | Admitting: Family Medicine

## 2021-05-25 ENCOUNTER — Ambulatory Visit (INDEPENDENT_AMBULATORY_CARE_PROVIDER_SITE_OTHER): Payer: 59 | Admitting: Family Medicine

## 2021-05-25 ENCOUNTER — Other Ambulatory Visit: Payer: Self-pay

## 2021-05-25 VITALS — BP 118/75 | HR 65 | Temp 97.5°F | Ht 73.5 in | Wt 212.4 lb

## 2021-05-25 DIAGNOSIS — Z0000 Encounter for general adult medical examination without abnormal findings: Secondary | ICD-10-CM | POA: Diagnosis not present

## 2021-05-25 DIAGNOSIS — Z8639 Personal history of other endocrine, nutritional and metabolic disease: Secondary | ICD-10-CM

## 2021-05-25 LAB — CBC WITH DIFFERENTIAL/PLATELET
Basophils Absolute: 0 10*3/uL (ref 0.0–0.1)
Basophils Relative: 0.4 % (ref 0.0–3.0)
Eosinophils Absolute: 0.1 10*3/uL (ref 0.0–0.7)
Eosinophils Relative: 1.6 % (ref 0.0–5.0)
HCT: 44.7 % (ref 39.0–52.0)
Hemoglobin: 15.5 g/dL (ref 13.0–17.0)
Lymphocytes Relative: 24.2 % (ref 12.0–46.0)
Lymphs Abs: 1.1 10*3/uL (ref 0.7–4.0)
MCHC: 34.8 g/dL (ref 30.0–36.0)
MCV: 90.7 fl (ref 78.0–100.0)
Monocytes Absolute: 0.5 10*3/uL (ref 0.1–1.0)
Monocytes Relative: 10.6 % (ref 3.0–12.0)
Neutro Abs: 2.8 10*3/uL (ref 1.4–7.7)
Neutrophils Relative %: 63.2 % (ref 43.0–77.0)
Platelets: 170 10*3/uL (ref 150.0–400.0)
RBC: 4.93 Mil/uL (ref 4.22–5.81)
RDW: 13.4 % (ref 11.5–15.5)
WBC: 4.5 10*3/uL (ref 4.0–10.5)

## 2021-05-25 LAB — COMPREHENSIVE METABOLIC PANEL
ALT: 19 U/L (ref 0–53)
AST: 22 U/L (ref 0–37)
Albumin: 5 g/dL (ref 3.5–5.2)
Alkaline Phosphatase: 71 U/L (ref 39–117)
BUN: 14 mg/dL (ref 6–23)
CO2: 30 mEq/L (ref 19–32)
Calcium: 9.9 mg/dL (ref 8.4–10.5)
Chloride: 100 mEq/L (ref 96–112)
Creatinine, Ser: 1.1 mg/dL (ref 0.40–1.50)
GFR: 80.58 mL/min (ref >60.00)
Glucose, Bld: 82 mg/dL (ref 70–99)
Potassium: 4.1 mEq/L (ref 3.5–5.1)
Sodium: 139 mEq/L (ref 135–145)
Total Bilirubin: 1.1 mg/dL (ref 0.2–1.2)
Total Protein: 7.6 g/dL (ref 6.0–8.3)

## 2021-05-25 LAB — LIPID PANEL
Cholesterol: 198 mg/dL (ref 0–200)
HDL: 44.6 mg/dL (ref >39.00)
NonHDL: 153.12
Total CHOL/HDL Ratio: 4
Triglycerides: 237 mg/dL — ABNORMAL HIGH (ref 0.0–149.0)
VLDL: 47.4 mg/dL — ABNORMAL HIGH (ref 0.0–40.0)

## 2021-05-25 LAB — T4, FREE: Free T4: 0.67 ng/dL (ref 0.60–1.60)

## 2021-05-25 LAB — TSH: TSH: 3.46 u[IU]/mL (ref 0.35–5.50)

## 2021-05-25 LAB — LDL CHOLESTEROL, DIRECT: Direct LDL: 105 mg/dL

## 2021-05-25 NOTE — Progress Notes (Signed)
Office Note 05/25/2021  CC:  Chief Complaint  Patient presents with   Annual Exam    Pt is fasting    HPI:  Patient is a 45 y.o. male who is here for annual health maintenance exam. A/P as of last cpe visit: "1) Change in bowel habits: about 10 mo consistently loose BMs, sensation of incomplete colon emptying.  No red flags. Will check celiac labs, CBC, CMET. With this problem PLUS his history of unexplained SBO a few years back I will ask GI to see him.   2) Health maintenance exam: Reviewed age and gender appropriate health maintenance issues (prudent diet, regular exercise, health risks of tobacco and excessive alcohol, use of seatbelts, fire alarms in home, use of sunscreen).  Also reviewed age and gender appropriate health screening as well as vaccine recommendations. Vaccines: Tdap UTD.  Covid 19->UTD.  Flu->declined. Labs: fasting HP labs ordered as well as free T4 and T3 total (pt with hx of thyroiditis). Prostate ca screening: average risk patient= as per latest guidelines, start screening at 48 yrs of age. Colon ca screening: average risk patient= as per latest guidelines, start screening at 45 yrs--->already referred to GI today for further eval of his change in bowel habits + history of unexplained SBO."  INTERIM HX: Feeling well, no complaints. Dr. Myrtie Neither did colonoscopy 07/29/20 and biopsies were normal, antispasmodic rx'd for possible IBS.  Romeo Apple chose to hold off on the labs and med. I had already done TTG IgA and total IgA on him and these were neg/normal.  [facility] for ADD->adderall.   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  F/u thyroid testing has shown TSH gradually normalizing and T4 and T3 levels either a tiny bit low or normal (normal 05/30/09).      Past Surgical History:  Procedure Laterality Date   COLONOSCOPY  07/29/2020   normal, bx normal   LASIK  2003   TONSILLECTOMY     VASECTOMY      Family History  Problem Relation Age of Onset   Lymphoma Father 75       NHL; died age 60   Esophageal cancer Father    Colon cancer Neg Hx    Pancreatic cancer Neg Hx    Stomach cancer Neg Hx    Rectal cancer Neg Hx     Social History   Socioeconomic History   Marital status: Married    Spouse name: Not on file   Number of children: Not on file   Years of education: Not on file   Highest education level: Not on file  Occupational History   Not on file  Tobacco Use   Smoking status: Former    Types: Cigarettes    Quit date: 08/22/2001    Years since quitting: 19.7   Smokeless tobacco: Never  Vaping Use   Vaping Use: Never used  Substance and Sexual Activity   Alcohol use: Yes    Comment: occ   Drug use: No   Sexual activity: Not on file  Other Topics Concern   Not on file  Social History Narrative   Married, has one  son and one daughter.   Relocated to [facility] 2006.     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   Lives in [place].     No Tob, 1 glass of wine with dinner.  No drugs.   Exercises 4 d/week--cardio and wt lifting.   Social Determinants of Health   Financial Resource Strain: Not on file  Food Insecurity: Not on file  Transportation Needs: Not on file  Physical Activity: Not on file  Stress: Not on file  Social Connections: Not on file  Intimate Partner Violence: Not on file    Outpatient Medications Prior to Visit  Medication Sig Dispense Refill   ADDERALL XR 20 MG 24 hr capsule Take 20 mg by mouth daily.     Cholecalciferol (VITAMIN D3) 125 MCG (5000 UT) TABS Take by mouth daily.     fluticasone (FLONASE) 50 MCG/ACT nasal spray  Place 2 sprays into both nostrils in the morning and at bedtime. 16 g 6   Omega-3 Fatty Acids (FISH OIL BURP-LESS PO) Take 1 capsule by mouth daily.     Zinc 30 MG CAPS Take by mouth.     doxycycline (VIBRA-TABS) 100 MG tablet Take 1 tablet (100 mg total) by mouth 2 (two) times daily. 20 tablet 0   hyoscyamine (LEVSIN) 0.125 MG tablet Take 1 tablet (0.125 mg total) by mouth 2 (two) times daily as needed (cramping and diarrhea). 60 tablet 1   VYVANSE 40 MG capsule Take 40 mg by mouth every morning.     No facility-administered medications prior to visit.    No Known Allergies  ROS Review of Systems  Constitutional:  Negative for appetite change, chills, fatigue and fever.  HENT:  Negative for congestion, dental problem, ear pain and sore throat.   Eyes:  Negative for discharge, redness and visual disturbance.  Respiratory:  Negative for cough, chest tightness, shortness of breath and wheezing.   Cardiovascular:  Negative for chest pain, palpitations and leg swelling.  Gastrointestinal:  Negative for abdominal pain, blood in stool, diarrhea, nausea and vomiting.  Genitourinary:  Negative for difficulty urinating, dysuria, flank pain, frequency, hematuria and urgency.  Musculoskeletal:  Negative for arthralgias, back pain, joint swelling, myalgias and neck stiffness.  Skin:  Negative for pallor and rash.  Neurological:  Negative for dizziness, speech difficulty, weakness and headaches.  Hematological:  Negative for adenopathy. Does not bruise/bleed easily.  Psychiatric/Behavioral:  Negative for confusion and sleep disturbance. The patient is not nervous/anxious.    PE; Vitals with BMI 05/25/2021 07/29/2020 07/29/2020  Height 6' 1.5" - -  Weight 212 lbs 6 oz - -  BMI 27.64 - -  Systolic 118 119 814  Diastolic 75 75 74  Pulse 65 64 63   Gen: Alert, well appearing.  Patient is oriented to person, place, time, and situation. AFFECT: pleasant, lucid thought and speech. ENT: Ears: EACs  clear, normal epithelium.  TMs with good light reflex and landmarks bilaterally.  Eyes: no injection, icteris, swelling, or exudate.  EOMI, PERRLA. Nose: no drainage or turbinate edema/swelling.  No injection or focal lesion.  Mouth: lips without lesion/swelling.  Oral mucosa pink and moist.  Dentition intact and without obvious caries or gingival swelling.  Oropharynx without erythema, exudate, or swelling.  Neck: supple/nontender.  No LAD, mass, or TM.  Carotid pulses 2+ bilaterally, without bruits. CV: RRR, no m/r/g.   LUNGS: CTA bilat, nonlabored resps, good aeration in all lung fields. ABD: soft, NT, ND, BS normal.  No hepatospenomegaly or mass.  No bruits. EXT: no clubbing, cyanosis, or edema.  Musculoskeletal: no joint swelling, erythema, warmth, or tenderness.  ROM of all joints intact. Skin - no sores or suspicious lesions or rashes or color changes  Pertinent labs:  Lab Results  Component Value Date   TSH 2.98 04/22/2020   Lab Results  Component Value Date   WBC 5.3 04/22/2020   HGB 14.5 04/22/2020   HCT 42.3 04/22/2020   MCV 89.8 04/22/2020   PLT 186 04/22/2020   Lab Results  Component Value Date   CREATININE 1.11 04/22/2020   BUN 12 04/22/2020   NA 141 04/22/2020   K 4.3 04/22/2020   CL 103 04/22/2020   CO2 25 04/22/2020   Lab Results  Component Value Date   ALT 29 04/22/2020   AST 23 04/22/2020   ALKPHOS 82 01/29/2018   BILITOT 0.7 04/22/2020   Lab Results  Component Value Date   CHOL 200 (H) 04/22/2020   Lab Results  Component Value Date   HDL 42 04/22/2020   Lab Results  Component Value Date   LDLCALC 126 (H) 04/22/2020   Lab Results  Component Value Date   TRIG 202 (H) 04/22/2020   Lab Results  Component Value Date   CHOLHDL 4.8 04/22/2020    ASSESSMENT AND PLAN:    Health maintenance exam: Reviewed age and gender appropriate health maintenance issues (prudent diet, regular exercise, health risks of tobacco and excessive alcohol, use of  seatbelts, fire alarms in home, use of sunscreen).  Also reviewed age and gender appropriate health screening as well as vaccine recommendations. Vaccines: flu->declined for now.  Tdap is utd.  Has hx of shingles.  Shingrix-->he'll check with insurer about coverage. Labs: fasting HP labs ordered as well as free T4 and T3 total (pt with hx of subacute thyroiditis). Prostate ca screening: average risk patient= as per latest guidelines, start screening at 58 yrs of age. Colon ca screening: he got colonoscopy last year for chronic diarrhea-->normal.  Rpt 10 yrs.  An After Visit Summary was printed and given to the patient.  FOLLOW UP:  Return in about 1 year (around 05/25/2022) for annual CPE (fasting).  Signed:  Santiago Bumpers, MD           05/25/2021

## 2021-05-25 NOTE — Patient Instructions (Signed)
Ask insurer about coverage for Shingrix vaccine   Health Maintenance, Male Adopting a healthy lifestyle and getting preventive care are important in promoting health and wellness. Ask your health care provider about: The right schedule for you to have regular tests and exams. Things you can do on your own to prevent diseases and keep yourself healthy. What should I know about diet, weight, and exercise? Eat a healthy diet  Eat a diet that includes plenty of vegetables, fruits, low-fat dairy products, and lean protein. Do not eat a lot of foods that are high in solid fats, added sugars, or sodium. Maintain a healthy weight Body mass index (BMI) is a measurement that can be used to identify possible weight problems. It estimates body fat based on height and weight. Your health care provider can help determine your BMI and help you achieve or maintain a healthy weight. Get regular exercise Get regular exercise. This is one of the most important things you can do for your health. Most adults should: Exercise for at least 150 minutes each week. The exercise should increase your heart rate and make you sweat (moderate-intensity exercise). Do strengthening exercises at least twice a week. This is in addition to the moderate-intensity exercise. Spend less time sitting. Even light physical activity can be beneficial. Watch cholesterol and blood lipids Have your blood tested for lipids and cholesterol at 46 years of age, then have this test every 5 years. You may need to have your cholesterol levels checked more often if: Your lipid or cholesterol levels are high. You are older than 46 years of age. You are at high risk for heart disease. What should I know about cancer screening? Many types of cancers can be detected early and may often be prevented. Depending on your health history and family history, you may need to have cancer screening at various ages. This may include screening for: Colorectal  cancer. Prostate cancer. Skin cancer. Lung cancer. What should I know about heart disease, diabetes, and high blood pressure? Blood pressure and heart disease High blood pressure causes heart disease and increases the risk of stroke. This is more likely to develop in people who have high blood pressure readings, are of African descent, or are overweight. Talk with your health care provider about your target blood pressure readings. Have your blood pressure checked: Every 3-5 years if you are 20-1 years of age. Every year if you are 18 years old or older. If you are between the ages of 24 and 36 and are a current or former smoker, ask your health care provider if you should have a one-time screening for abdominal aortic aneurysm (AAA). Diabetes Have regular diabetes screenings. This checks your fasting blood sugar level. Have the screening done: Once every three years after age 35 if you are at a normal weight and have a low risk for diabetes. More often and at a younger age if you are overweight or have a high risk for diabetes. What should I know about preventing infection? Hepatitis B If you have a higher risk for hepatitis B, you should be screened for this virus. Talk with your health care provider to find out if you are at risk for hepatitis B infection. Hepatitis C Blood testing is recommended for: Everyone born from [birth year] through 1966. Anyone with known risk factors for hepatitis C. Sexually transmitted infections (STIs) You should be screened each year for STIs, including gonorrhea and chlamydia, if: You are sexually active and are younger than 46  years of age. You are older than 46 years of age and your health care provider tells you that you are at risk for this type of infection. Your sexual activity has changed since you were last screened, and you are at increased risk for chlamydia or gonorrhea. Ask your health care provider if you are at risk. Ask your health care provider  about whether you are at high risk for HIV. Your health care provider may recommend a prescription medicine to help prevent HIV infection. If you choose to take medicine to prevent HIV, you should first get tested for HIV. You should then be tested every 3 months for as long as you are taking the medicine. Follow these instructions at home: Lifestyle Do not use any products that contain nicotine or tobacco, such as cigarettes, e-cigarettes, and chewing tobacco. If you need help quitting, ask your health care provider. Do not use street drugs. Do not share needles. Ask your health care provider for help if you need support or information about quitting drugs. Alcohol use Do not drink alcohol if your health care provider tells you not to drink. If you drink alcohol: Limit how much you have to 0-2 drinks a day. Be aware of how much alcohol is in your drink. In the [place] one drink equals one 12 oz bottle of beer (355 mL), one 5 oz glass of wine (148 mL), or one 1 oz glass of hard liquor (44 mL). General instructions Schedule regular health, dental, and eye exams. Stay current with your vaccines. Tell your health care provider if: You often feel depressed. You have ever been abused or do not feel safe at home. Summary Adopting a healthy lifestyle and getting preventive care are important in promoting health and wellness. Follow your health care provider's instructions about healthy diet, exercising, and getting tested or screened for diseases. Follow your health care provider's instructions on monitoring your cholesterol and blood pressure. This information is not intended to replace advice given to you by your health care provider. Make sure you discuss any questions you have with your health care provider. Document Revised: 10/16/2020 Document Reviewed: 08/01/2018 Elsevier Patient Education  2022 [employer].

## 2021-05-26 LAB — T3: T3, Total: 98 ng/dL (ref 76–181)

## 2022-06-09 ENCOUNTER — Encounter: Payer: Self-pay | Admitting: Family Medicine

## 2022-06-09 ENCOUNTER — Ambulatory Visit (INDEPENDENT_AMBULATORY_CARE_PROVIDER_SITE_OTHER): Payer: 59 | Admitting: Family Medicine

## 2022-06-09 VITALS — BP 120/79 | HR 65 | Temp 97.7°F | Ht 73.5 in | Wt 217.0 lb

## 2022-06-09 DIAGNOSIS — H60392 Other infective otitis externa, left ear: Secondary | ICD-10-CM

## 2022-06-09 DIAGNOSIS — H9202 Otalgia, left ear: Secondary | ICD-10-CM

## 2022-06-09 MED ORDER — AMOXICILLIN 875 MG PO TABS: 875.0000 mg | ORAL_TABLET | Freq: Two times a day (BID) | ORAL | 0 refills | Status: AC

## 2022-06-09 MED ORDER — NEOMYCIN-POLYMYXIN-HC 3.5-10000-1 OT SOLN: 3.0000 [drp] | Freq: Three times a day (TID) | OTIC | 2 refills | Status: AC

## 2022-06-09 NOTE — Progress Notes (Signed)
OFFICE VISIT  06/09/2022  CC:  Chief Complaint  Patient presents with   Ear Pain    Left x 4 days, constant stabbing pain that causes headaches and radiates down left side of his neck.     Patient is a 46 y.o. male who presents for left ear pain.  HPI: About 4 days ago and his left ear started to hurt.  No drainage.  He wears ear buds/AirPods and says he often gets some pain that waxes and wanes but this is stayed.  He says he has had a otitis externa in this setting before that required antibiotic drops. He had some old drops at home and has been using these lately. He had a cold a few weeks ago but currently has no nasal congestion or runny nose, no cough, no fevers.  He feels well other than the ear pain.    Past Medical History:  Diagnosis Date   COVID-19 virus infection 03/2020   H/O vasectomy 2015   History of shingles 05/2020   L low/mid back   Small bowel obstruction ([place]) 06/2014   No surgery necessary   Subacute thyroiditis 12/2017   Dr. Steffanie Dunn 02/2018-->retesting of thyroid labs showed pt is now in HYPOthyroid phase of thyroiditis---likely will return to normal spontaneously.  F/u thyroid testing has shown TSH gradually normalizing and T4 and T3 levels either a tiny bit low or normal (normal 05/30/09).      Past Surgical History:  Procedure Laterality Date   COLONOSCOPY  07/29/2020   normal, bx normal   LASIK  2003   TONSILLECTOMY     VASECTOMY      Outpatient Medications Prior to Visit  Medication Sig Dispense Refill   ADDERALL XR 20 MG 24 hr capsule Take 20 mg by mouth daily.     Cholecalciferol (VITAMIN D3) 125 MCG (5000 UT) TABS Take by mouth daily.     Omega-3 Fatty Acids (FISH OIL BURP-LESS PO) Take 1 capsule by mouth daily.     Zinc 30 MG CAPS Take by mouth.     fluticasone (FLONASE) 50 MCG/ACT nasal spray Place 2 sprays into both nostrils in the morning and at bedtime. (Patient not taking: Reported on 06/09/2022) 16 g 6   No facility-administered  medications prior to visit.    No Known Allergies  ROS As per HPI  PE:    06/09/2022   11:24 AM 05/25/2021   10:01 AM 07/29/2020    4:10 PM  Vitals with BMI  Height 6' 1.5" 6' 1.5"   Weight 217 lbs 212 lbs 6 oz   BMI 21.19 41.74   Systolic 081 448 185  Diastolic 79 75 75  Pulse 65 65 64     Physical Exam  Gen: Alert, well appearing.  Patient is oriented to person, place, time, and situation. EARS: External ear anatomy nontender and nonerythematous, external auditory canals are without erythema or swelling or exudate.  Tympanic membranes without bulging, erythema, or dullness.  LABS:  none  IMPRESSION AND PLAN:  Left otalgia. Question otitis externa. He is in a lot of pain. Amoxicillin 875 twice daily x7 days. Cortisporin otic 3 drops in left ear 3 times daily.  An After Visit Summary was printed and given to the patient.  FOLLOW UP: Return if symptoms worsen or fail to improve.  Signed:  Crissie Sickles, MD           06/09/2022

## 2023-01-31 NOTE — Patient Instructions (Signed)

## 2023-02-01 ENCOUNTER — Encounter: Payer: Self-pay | Admitting: Family Medicine

## 2023-02-01 ENCOUNTER — Ambulatory Visit (INDEPENDENT_AMBULATORY_CARE_PROVIDER_SITE_OTHER): Payer: 59 | Admitting: Family Medicine

## 2023-02-01 VITALS — BP 123/86 | HR 61 | Ht 74.0 in | Wt 214.6 lb

## 2023-02-01 DIAGNOSIS — Z0000 Encounter for general adult medical examination without abnormal findings: Secondary | ICD-10-CM

## 2023-02-01 DIAGNOSIS — Z23 Encounter for immunization: Secondary | ICD-10-CM | POA: Diagnosis not present

## 2023-02-01 DIAGNOSIS — E782 Mixed hyperlipidemia: Secondary | ICD-10-CM

## 2023-02-01 DIAGNOSIS — Z8639 Personal history of other endocrine, nutritional and metabolic disease: Secondary | ICD-10-CM

## 2023-02-01 NOTE — Progress Notes (Signed)
Office Note 02/01/2023  CC:  Chief Complaint  Patient presents with   Annual Exam    Pt is fasting.     HPI:  Patient is a 47 y.o. male who is here for annual health maintenance exam.  Edwin Sparks is feeling well. No acute concerns.  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  F/u thyroid testing has shown TSH gradually normalizing and T4 and T3 levels either a tiny bit low or normal (normal 05/30/09).      Past Surgical History:  Procedure Laterality Date   COLONOSCOPY  07/29/2020   normal, bx normal   LASIK  2003   TONSILLECTOMY     VASECTOMY      Family History  Problem Relation Age of Onset   Lymphoma Father 74       NHL; died age 83   Esophageal cancer Father    Colon cancer Neg Hx    Pancreatic cancer Neg Hx    Stomach cancer Neg Hx    Rectal cancer Neg Hx     Social History   Socioeconomic History   Marital status: Married    Spouse name: Not on file   Number of children: Not on file   Years of education: Not on file   Highest education level: Not on file  Occupational History   Not on file  Tobacco Use   Smoking status: Former    Types: Cigarettes    Quit date: 08/22/2001    Years since quitting: 21.4   Smokeless tobacco: Never  Vaping Use   Vaping Use: Never used  Substance and Sexual Activity   Alcohol use: Yes    Comment: occ   Drug use: No   Sexual activity: Not on file  Other Topics Concern   Not on file  Social History Narrative   Married, has one son and one daughter.   Relocated to [facility] 2006.     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   Lives in [place].     No Tob, 1  glass of wine with dinner.  No drugs.   Exercises 4 d/week--cardio and wt lifting.   Social Determinants of Health   Financial Resource Strain: Not on file  Food Insecurity: Not on file  Transportation Needs: Not on file  Physical Activity: Not on file  Stress: Not on file  Social Connections: Not on file  Intimate Partner Violence: Not on file    Outpatient Medications Prior to Visit  Medication Sig Dispense Refill   ADDERALL XR 20 MG 24 hr capsule Take 20 mg by mouth daily.     Cholecalciferol (VITAMIN D3) 125 MCG (5000 UT) TABS Take by mouth daily.     Omega-3 Fatty Acids (FISH OIL BURP-LESS PO) Take 1 capsule by mouth daily.     Zinc 30 MG CAPS Take by mouth.     fluticasone (FLONASE) 50 MCG/ACT nasal spray Place 2 sprays into both nostrils in the morning and at bedtime. (Patient not taking: Reported on 06/09/2022) 16 g 6   neomycin-polymyxin-hydrocortisone (CORTISPORIN) OTIC solution Place 3 drops into the left ear 3 (three) times daily. (Patient  not taking: Reported on 02/01/2023) 10 mL 2   No facility-administered medications prior to visit.    No Known Allergies  Review of Systems  Constitutional:  Negative for appetite change, chills, fatigue and fever.  HENT:  Negative for congestion, dental problem, ear pain and sore throat.   Eyes:  Negative for discharge, redness and visual disturbance.  Respiratory:  Negative for cough, chest tightness, shortness of breath and wheezing.   Cardiovascular:  Negative for chest pain, palpitations and leg swelling.  Gastrointestinal:  Negative for abdominal pain, blood in stool, diarrhea, nausea and vomiting.  Genitourinary:  Negative for difficulty urinating, dysuria, flank pain, frequency, hematuria and urgency.  Musculoskeletal:  Negative for arthralgias, back pain, joint swelling, myalgias and neck stiffness.  Skin:  Negative for pallor and rash.  Neurological:  Negative for dizziness, speech difficulty, weakness and headaches.   Hematological:  Negative for adenopathy. Does not bruise/bleed easily.  Psychiatric/Behavioral:  Negative for confusion and sleep disturbance. The patient is not nervous/anxious.     PE;    02/01/2023    8:56 AM 06/09/2022   11:24 AM 05/25/2021   10:01 AM  Vitals with BMI  Height 6\' 2"  6' 1.5" 6' 1.5"  Weight 214 lbs 10 oz 217 lbs 212 lbs 6 oz  BMI 27.54 28.24 27.64  Systolic 123 120 045  Diastolic 86 79 75  Pulse 61 65 65   Gen: Alert, well appearing.  Patient is oriented to person, place, time, and situation. AFFECT: pleasant, lucid thought and speech. ENT: Ears: EACs clear, normal epithelium.  TMs with good light reflex and landmarks bilaterally.  Eyes: no injection, icteris, swelling, or exudate.  EOMI, PERRLA. Nose: no drainage or turbinate edema/swelling.  No injection or focal lesion.  Mouth: lips without lesion/swelling.  Oral mucosa pink and moist.  Dentition intact and without obvious caries or gingival swelling.  Oropharynx without erythema, exudate, or swelling.  Neck: supple/nontender.  No LAD, mass, or TM.  Carotid pulses 2+ bilaterally, without bruits. CV: RRR, no m/r/g.   LUNGS: CTA bilat, nonlabored resps, good aeration in all lung fields. ABD: soft, NT, ND, BS normal.  No hepatospenomegaly or mass.  No bruits. EXT: no clubbing, cyanosis, or edema.  Musculoskeletal: no joint swelling, erythema, warmth, or tenderness.  ROM of all joints intact. Skin - no sores or suspicious lesions or rashes or color changes  Pertinent labs:  Lab Results  Component Value Date   TSH 3.46 05/25/2021   T3TOTAL 98 05/25/2021  Free T4 on 05/25/21 was 0.67 (low end of nl)  Lab Results  Component Value Date   WBC 4.5 05/25/2021   HGB 15.5 05/25/2021   HCT 44.7 05/25/2021   MCV 90.7 05/25/2021   PLT 170.0 05/25/2021   Lab Results  Component Value Date   CREATININE 1.10 05/25/2021   BUN 14 05/25/2021   NA 139 05/25/2021   K 4.1 05/25/2021   CL 100 05/25/2021   CO2 30  05/25/2021   Lab Results  Component Value Date   ALT 19 05/25/2021   AST 22 05/25/2021   ALKPHOS 71 05/25/2021   BILITOT 1.1 05/25/2021   Lab Results  Component Value Date   CHOL 198 05/25/2021   Lab Results  Component Value Date   HDL 44.60 05/25/2021   Lab Results  Component Value Date   LDLCALC 126 (H) 04/22/2020   Lab Results  Component Value Date   TRIG 237.0 (H) 05/25/2021   Lab Results  Component Value Date  CHOLHDL 4 05/25/2021     ASSESSMENT AND PLAN:   Health maintenance exam: Reviewed age and gender appropriate health maintenance issues (prudent diet, regular exercise, health risks of tobacco and excessive alcohol, use of seatbelts, fire alarms in home, use of sunscreen).  Also reviewed age and gender appropriate health screening as well as vaccine recommendations. Vaccines:  Tdap-->given today.   Labs: fasting HP labs ordered as well as free T4 and T3 total (pt with hx of subacute thyroiditis). Prostate ca screening: average risk patient= as per latest guidelines, start screening at 71 yrs of age. Colon ca screening: he got colonoscopy 07/2020 for chronic diarrhea-->normal.  Rpt 10 yrs.  An After Visit Summary was printed and given to the patient.  FOLLOW UP:  Return in about 1 year (around 02/01/2024) for annual CPE (fasting).  Signed:  Santiago Bumpers, MD           02/01/2023

## 2023-02-02 LAB — CBC WITH DIFFERENTIAL/PLATELET
Absolute Monocytes: 546 cells/uL (ref 200–950)
Basophils Absolute: 30 cells/uL (ref 0–200)
Basophils Relative: 0.7 %
Eosinophils Absolute: 82 cells/uL (ref 15–500)
Eosinophils Relative: 1.9 %
HCT: 44.5 % (ref 38.5–50.0)
Hemoglobin: 15 g/dL (ref 13.2–17.1)
Lymphs Abs: 1011 cells/uL (ref 850–3900)
MCH: 31.1 pg (ref 27.0–33.0)
MCHC: 33.7 g/dL (ref 32.0–36.0)
MCV: 92.1 fL (ref 80.0–100.0)
MPV: 11.7 fL (ref 7.5–12.5)
Monocytes Relative: 12.7 %
Neutro Abs: 2632 cells/uL (ref 1500–7800)
Neutrophils Relative %: 61.2 %
Platelets: 184 10*3/uL (ref 140–400)
RBC: 4.83 10*6/uL (ref 4.20–5.80)
RDW: 13 % (ref 11.0–15.0)
Total Lymphocyte: 23.5 %
WBC: 4.3 10*3/uL (ref 3.8–10.8)

## 2023-02-02 LAB — LIPID PANEL
Cholesterol: 212 mg/dL — ABNORMAL HIGH (ref <200)
HDL: 45 mg/dL (ref >=40)
LDL Cholesterol (Calc): 139 mg/dL (calc) — ABNORMAL HIGH
Non-HDL Cholesterol (Calc): 167 mg/dL (calc) — ABNORMAL HIGH (ref <130)
Total CHOL/HDL Ratio: 4.7 (calc) (ref <5.0)
Triglycerides: 150 mg/dL — ABNORMAL HIGH (ref <150)

## 2023-02-02 LAB — COMPREHENSIVE METABOLIC PANEL
AG Ratio: 1.9 (calc) (ref 1.0–2.5)
ALT: 15 U/L (ref 9–46)
AST: 20 U/L (ref 10–40)
Albumin: 4.6 g/dL (ref 3.6–5.1)
Alkaline phosphatase (APISO): 74 U/L (ref 36–130)
BUN: 19 mg/dL (ref 7–25)
CO2: 25 mmol/L (ref 20–32)
Calcium: 9.5 mg/dL (ref 8.6–10.3)
Chloride: 103 mmol/L (ref 98–110)
Creat: 1.21 mg/dL (ref 0.60–1.29)
Globulin: 2.4 g/dL (calc) (ref 1.9–3.7)
Glucose, Bld: 88 mg/dL (ref 65–99)
Potassium: 4.3 mmol/L (ref 3.5–5.3)
Sodium: 139 mmol/L (ref 135–146)
Total Bilirubin: 1.2 mg/dL (ref 0.2–1.2)
Total Protein: 7 g/dL (ref 6.1–8.1)

## 2023-02-02 LAB — TSH: TSH: 4.01 mIU/L (ref 0.40–4.50)

## 2023-02-02 LAB — T4, FREE: Free T4: 1 ng/dL (ref 0.8–1.8)

## 2023-02-02 LAB — T3: T3, Total: 85 ng/dL (ref 76–181)

## 2023-09-11 ENCOUNTER — Ambulatory Visit: Payer: 59 | Admitting: Family Medicine

## 2023-09-11 ENCOUNTER — Encounter: Payer: Self-pay | Admitting: Family Medicine

## 2023-09-11 ENCOUNTER — Ambulatory Visit (HOSPITAL_BASED_OUTPATIENT_CLINIC_OR_DEPARTMENT_OTHER): Payer: 59

## 2023-09-11 VITALS — BP 130/88 | HR 76 | Wt 212.6 lb

## 2023-09-11 DIAGNOSIS — R1084 Generalized abdominal pain: Secondary | ICD-10-CM

## 2023-09-11 DIAGNOSIS — K56600 Partial intestinal obstruction, unspecified as to cause: Secondary | ICD-10-CM | POA: Diagnosis not present

## 2023-09-11 DIAGNOSIS — R112 Nausea with vomiting, unspecified: Secondary | ICD-10-CM | POA: Diagnosis not present

## 2023-09-11 NOTE — Progress Notes (Signed)
OFFICE VISIT  09/11/2023  CC:  Chief Complaint  Patient presents with   GI Problem    Friday morning; vomiting, nausea, loose stool, cramping/aching pain. Pt has hx of bowel obstruction. Pt has not taken anything to relieve symptoms.     Patient is a 48 y.o. male who presents accompanied by his wife for abdominal concern.  HPI: Onset 3 d/a sense of incomplete emptying of bowels, followed later that day by upper abd distention/gas/pain that led to vomiting.  This was then followed by repeated watery stools. Now he has trouble eating or drinking anything at all b/c of bad upper abd pain and sense of needing to vomit.  Has limited himself to sips of water for the last 24 hours.  No fevers, no recent sick contacts, no unusual foods recently. Still passing small amounts of watery stools.  No blood or pus in stool.    History of unexplained small bowel obstruction 2015--> admission, resolved with nonsurgical management. He then had similar but milder episode in 2017, did not require hospitalization. He has not required any abdominal surgery.  He had a screening colonoscopy 3 years ago which was normal.  ROS as above, plus--> no CP, no SOB, no wheezing, no cough, no dizziness, no HAs, no rashes, no melena/hematochezia.  No polyuria or polydipsia.  No myalgias or arthralgias.  No focal weakness, paresthesias, or tremors.  No acute vision or hearing abnormalities.  No dysuria or unusual/new urinary urgency or frequency.  No recent changes in lower legs.  No palpitations.     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  F/u thyroid testing has shown TSH  gradually normalizing and T4 and T3 levels either a tiny bit low or normal (normal 05/30/09).      Past Surgical History:  Procedure Laterality Date   COLONOSCOPY  07/29/2020   normal, bx normal   LASIK  2003   TONSILLECTOMY     VASECTOMY      Outpatient Medications Prior to Visit  Medication Sig Dispense Refill   ADDERALL XR 20 MG 24 hr capsule Take 20 mg by mouth daily.     Cholecalciferol (VITAMIN D3) 125 MCG (5000 UT) TABS Take by mouth daily.     Omega-3 Fatty Acids (FISH OIL BURP-LESS PO) Take 1 capsule by mouth daily.     Zinc 30 MG CAPS Take by mouth.     fluticasone (FLONASE) 50 MCG/ACT nasal spray Place 2 sprays into both nostrils in the morning and at bedtime. (Patient not taking: Reported on 09/11/2023) 16 g 6   neomycin-polymyxin-hydrocortisone (CORTISPORIN) OTIC solution Place 3 drops into the left ear 3 (three) times daily. (Patient not taking: Reported on 09/11/2023) 10 mL 2   No facility-administered medications prior to visit.    No Known Allergies  Review of Systems  As per HPI  PE:    09/11/2023    2:06 PM 02/01/2023    8:56 AM 06/09/2022   11:24 AM  Vitals with BMI  Height  6\' 2"  6' 1.5"  Weight 212 lbs 10 oz 214 lbs 10 oz 217 lbs  BMI  27.54 28.24  Systolic 130  123 120  Diastolic 88 86 79  Pulse 76 61 65     Physical Exam  Gen: Alert, well appearing.  Patient is oriented to person, place, time, and situation. AFFECT: pleasant, lucid thought and speech. QIH:KVQQ: no injection, icteris, swelling, or exudate.  EOMI, PERRLA. Mouth: lips without lesion/swelling.  Oral mucosa pink and moist. Oropharynx without erythema, exudate, or swelling.  CV: RRR, no m/r/g.   LUNGS: CTA bilat, nonlabored resps, good aeration in all lung fields. ABD: Mild distention noted, soft, mild generalized tenderness particularly around left periumbilical region.  He has bowel sounds in all 4 quadrants which are slightly high-pitched. No guarding.  Question rebound tenderness.   No mass or bruit.  No palpable organomegaly. EXT: no clubbing or cyanosis.  no edema.   LABS:  Last CBC Lab Results  Component Value Date   WBC 4.3 02/01/2023   HGB 15.0 02/01/2023   HCT 44.5 02/01/2023   MCV 92.1 02/01/2023   MCH 31.1 02/01/2023   RDW 13.0 02/01/2023   PLT 184 02/01/2023   Last metabolic panel Lab Results  Component Value Date   GLUCOSE 88 02/01/2023   NA 139 02/01/2023   K 4.3 02/01/2023   CL 103 02/01/2023   CO2 25 02/01/2023   BUN 19 02/01/2023   CREATININE 1.21 02/01/2023   GFR 80.58 05/25/2021   CALCIUM 9.5 02/01/2023   PROT 7.0 02/01/2023   ALBUMIN 5.0 05/25/2021   BILITOT 1.2 02/01/2023   ALKPHOS 71 05/25/2021   AST 20 02/01/2023   ALT 15 02/01/2023   ANIONGAP 13 07/05/2014   Lab Results  Component Value Date   ESRSEDRATE 12 01/29/2018   Lab Results  Component Value Date   LIPASE 80 (H) 07/02/2014   IMPRESSION AND PLAN:  Generalized abdominal pain, nausea and vomiting, loose stools.  This presentation is the same as his past for small bowel obstruction. Unknown etiology of the obstruction. Discussed management options today.  He has had a bad experience at the hospital with NG tube placement.  He wants to avoid this or any surgical intervention if at all possible. I think it is okay at this point to proceed with outpatient CT abdomen and pelvis-> he will get this this evening in a few hours. Check stat CBC, c-Met, lipase, sed rate, and CRP. Continue with sips of clear liquids for now.  Considering GI versus general surgery referral. Patient has voiced his preference to avoid general surgery referral at this time if at all possible. Signs/symptoms to call or return or go to the emergency department for were reviewed and pt expressed understanding.  An After Visit Summary was printed and given to the patient.  FOLLOW UP: Return in about 1 day (around 09/12/2023) for f/u abd pain.  Signed:  Santiago Bumpers, MD           09/11/2023

## 2023-09-12 ENCOUNTER — Other Ambulatory Visit: Payer: Self-pay | Admitting: Family Medicine

## 2023-09-12 ENCOUNTER — Encounter: Payer: Self-pay | Admitting: Family Medicine

## 2023-09-12 ENCOUNTER — Ambulatory Visit: Payer: 59

## 2023-09-12 DIAGNOSIS — K56600 Partial intestinal obstruction, unspecified as to cause: Secondary | ICD-10-CM

## 2023-09-12 DIAGNOSIS — R1084 Generalized abdominal pain: Secondary | ICD-10-CM

## 2023-09-12 DIAGNOSIS — Z8719 Personal history of other diseases of the digestive system: Secondary | ICD-10-CM

## 2023-09-12 DIAGNOSIS — R197 Diarrhea, unspecified: Secondary | ICD-10-CM

## 2023-09-12 LAB — SEDIMENTATION RATE: Sed Rate: 6 mm/h (ref 0–15)

## 2023-09-12 LAB — COMPREHENSIVE METABOLIC PANEL
AG Ratio: 2.3 (calc) (ref 1.0–2.5)
ALT: 21 U/L (ref 9–46)
AST: 20 U/L (ref 10–40)
Albumin: 5.3 g/dL — ABNORMAL HIGH (ref 3.6–5.1)
Alkaline phosphatase (APISO): 85 U/L (ref 36–130)
BUN: 17 mg/dL (ref 7–25)
CO2: 30 mmol/L (ref 20–32)
Calcium: 10 mg/dL (ref 8.6–10.3)
Chloride: 101 mmol/L (ref 98–110)
Creat: 1.28 mg/dL (ref 0.60–1.29)
Globulin: 2.3 g/dL (ref 1.9–3.7)
Glucose, Bld: 92 mg/dL (ref 65–99)
Potassium: 4.7 mmol/L (ref 3.5–5.3)
Sodium: 137 mmol/L (ref 135–146)
Total Bilirubin: 1.2 mg/dL (ref 0.2–1.2)
Total Protein: 7.6 g/dL (ref 6.1–8.1)

## 2023-09-12 LAB — CBC WITH DIFFERENTIAL/PLATELET
Absolute Lymphocytes: 895 {cells}/uL (ref 850–3900)
Absolute Monocytes: 756 {cells}/uL (ref 200–950)
Basophils Absolute: 19 {cells}/uL (ref 0–200)
Basophils Relative: 0.3 %
Eosinophils Absolute: 69 {cells}/uL (ref 15–500)
Eosinophils Relative: 1.1 %
HCT: 46.7 % (ref 38.5–50.0)
Hemoglobin: 15.6 g/dL (ref 13.2–17.1)
MCH: 30.2 pg (ref 27.0–33.0)
MCHC: 33.4 g/dL (ref 32.0–36.0)
MCV: 90.3 fL (ref 80.0–100.0)
MPV: 11.2 fL (ref 7.5–12.5)
Monocytes Relative: 12 %
Neutro Abs: 4561 {cells}/uL (ref 1500–7800)
Neutrophils Relative %: 72.4 %
Platelets: 186 10*3/uL (ref 140–400)
RBC: 5.17 10*6/uL (ref 4.20–5.80)
RDW: 12.7 % (ref 11.0–15.0)
Total Lymphocyte: 14.2 %
WBC: 6.3 10*3/uL (ref 3.8–10.8)

## 2023-09-12 LAB — C-REACTIVE PROTEIN: CRP: 17.6 mg/L — ABNORMAL HIGH (ref <8.0)

## 2023-09-12 LAB — LIPASE: Lipase: 14 U/L (ref 7–60)

## 2023-09-12 MED ORDER — IOHEXOL 300 MG/ML  SOLN
100.0000 mL | Freq: Once | INTRAMUSCULAR | Status: AC | PRN
  Administered 2023-09-12: 100 mL via INTRAVENOUS

## 2023-09-13 ENCOUNTER — Other Ambulatory Visit: Payer: Self-pay

## 2023-09-13 DIAGNOSIS — R197 Diarrhea, unspecified: Secondary | ICD-10-CM

## 2023-09-13 NOTE — Telephone Encounter (Signed)
OK, sure I'll order this test.  Pls come in for stool collection kit.

## 2023-09-15 ENCOUNTER — Encounter: Payer: Self-pay | Admitting: Family Medicine

## 2023-09-15 LAB — SALMONELLA/SHIGELLA CULT, CAMPY EIA AND SHIGA TOXIN RFL ECOLI
MICRO NUMBER: 15984276
MICRO NUMBER:: 15984277
MICRO NUMBER:: 15984278
Result:: NOT DETECTED
SHIGA RESULT:: NOT DETECTED
SPECIMEN QUALITY: ADEQUATE
SPECIMEN QUALITY:: ADEQUATE
SPECIMEN QUALITY:: ADEQUATE

## 2023-09-17 ENCOUNTER — Encounter: Payer: Self-pay | Admitting: Family Medicine

## 2023-11-03 ENCOUNTER — Encounter: Payer: Self-pay | Admitting: Family Medicine

## 2024-02-07 ENCOUNTER — Ambulatory Visit (INDEPENDENT_AMBULATORY_CARE_PROVIDER_SITE_OTHER): Admitting: Family Medicine

## 2024-02-07 ENCOUNTER — Encounter: Payer: Self-pay | Admitting: Family Medicine

## 2024-02-07 VITALS — BP 126/84 | HR 66 | Temp 97.5°F | Ht 73.75 in | Wt 214.8 lb

## 2024-02-07 DIAGNOSIS — Z0000 Encounter for general adult medical examination without abnormal findings: Secondary | ICD-10-CM

## 2024-02-07 DIAGNOSIS — Z125 Encounter for screening for malignant neoplasm of prostate: Secondary | ICD-10-CM

## 2024-02-07 DIAGNOSIS — E781 Pure hyperglyceridemia: Secondary | ICD-10-CM

## 2024-02-07 DIAGNOSIS — Z8639 Personal history of other endocrine, nutritional and metabolic disease: Secondary | ICD-10-CM | POA: Diagnosis not present

## 2024-02-07 NOTE — Patient Instructions (Signed)
 Health Maintenance, Male  Adopting a healthy lifestyle and getting preventive care are important in promoting health and wellness. Ask your health care provider about:  The right schedule for you to have regular tests and exams.  Things you can do on your own to prevent diseases and keep yourself healthy.  What should I know about diet, weight, and exercise?  Eat a healthy diet    Eat a diet that includes plenty of vegetables, fruits, low-fat dairy products, and lean protein.  Do not eat a lot of foods that are high in solid fats, added sugars, or sodium.  Maintain a healthy weight  Body mass index (BMI) is a measurement that can be used to identify possible weight problems. It estimates body fat based on height and weight. Your health care provider can help determine your BMI and help you achieve or maintain a healthy weight.  Get regular exercise  Get regular exercise. This is one of the most important things you can do for your health. Most adults should:  Exercise for at least 150 minutes each week. The exercise should increase your heart rate and make you sweat (moderate-intensity exercise).  Do strengthening exercises at least twice a week. This is in addition to the moderate-intensity exercise.  Spend less time sitting. Even light physical activity can be beneficial.  Watch cholesterol and blood lipids  Have your blood tested for lipids and cholesterol at 49 years of age, then have this test every 5 years.  You may need to have your cholesterol levels checked more often if:  Your lipid or cholesterol levels are high.  You are older than 49 years of age.  You are at high risk for heart disease.  What should I know about cancer screening?  Many types of cancers can be detected early and may often be prevented. Depending on your health history and family history, you may need to have cancer screening at various ages. This may include screening for:  Colorectal cancer.  Prostate cancer.  Skin cancer.  Lung  cancer.  What should I know about heart disease, diabetes, and high blood pressure?  Blood pressure and heart disease  High blood pressure causes heart disease and increases the risk of stroke. This is more likely to develop in people who have high blood pressure readings or are overweight.  Talk with your health care provider about your target blood pressure readings.  Have your blood pressure checked:  Every 3-5 years if you are 9-95 years of age.  Every year if you are 85 years old or older.  If you are between the ages of 29 and 29 and are a current or former smoker, ask your health care provider if you should have a one-time screening for abdominal aortic aneurysm (AAA).  Diabetes  Have regular diabetes screenings. This checks your fasting blood sugar level. Have the screening done:  Once every three years after age 23 if you are at a normal weight and have a low risk for diabetes.  More often and at a younger age if you are overweight or have a high risk for diabetes.  What should I know about preventing infection?  Hepatitis B  If you have a higher risk for hepatitis B, you should be screened for this virus. Talk with your health care provider to find out if you are at risk for hepatitis B infection.  Hepatitis C  Blood testing is recommended for:  Everyone born from [birth year] through 1966.  Anyone  with known risk factors for hepatitis C.  Sexually transmitted infections (STIs)  You should be screened each year for STIs, including gonorrhea and chlamydia, if:  You are sexually active and are younger than 49 years of age.  You are older than 49 years of age and your health care provider tells you that you are at risk for this type of infection.  Your sexual activity has changed since you were last screened, and you are at increased risk for chlamydia or gonorrhea. Ask your health care provider if you are at risk.  Ask your health care provider about whether you are at high risk for HIV. Your health care provider  may recommend a prescription medicine to help prevent HIV infection. If you choose to take medicine to prevent HIV, you should first get tested for HIV. You should then be tested every 3 months for as long as you are taking the medicine.  Follow these instructions at home:  Alcohol use  Do not drink alcohol if your health care provider tells you not to drink.  If you drink alcohol:  Limit how much you have to 0-2 drinks a day.  Know how much alcohol is in your drink. In the [place] one drink equals one 12 oz bottle of beer (355 mL), one 5 oz glass of wine (148 mL), or one 1 oz glass of hard liquor (44 mL).  Lifestyle  Do not use any products that contain nicotine or tobacco. These products include cigarettes, chewing tobacco, and vaping devices, such as e-cigarettes. If you need help quitting, ask your health care provider.  Do not use street drugs.  Do not share needles.  Ask your health care provider for help if you need support or information about quitting drugs.  General instructions  Schedule regular health, dental, and eye exams.  Stay current with your vaccines.  Tell your health care provider if:  You often feel depressed.  You have ever been abused or do not feel safe at home.  Summary  Adopting a healthy lifestyle and getting preventive care are important in promoting health and wellness.  Follow your health care provider's instructions about healthy diet, exercising, and getting tested or screened for diseases.  Follow your health care provider's instructions on monitoring your cholesterol and blood pressure.  This information is not intended to replace advice given to you by your health care provider. Make sure you discuss any questions you have with your health care provider.  Document Revised: 12/28/2020 Document Reviewed: 12/28/2020  Elsevier Patient Education  2024 [employer].

## 2024-02-07 NOTE — Progress Notes (Signed)
 Office Note 02/07/2024  CC:  Chief Complaint  Patient presents with   Annual Exam    Pt is fasting   Patient is a 48 y.o. male who is here for annual health maintenance exam, Lovena Rubinstein feels well. He is active and feels like he eats a healthy diet.  Currently without any acute concerns.   Past Medical History:  Diagnosis Date   COVID-19 virus infection 03/2020   H/O vasectomy 2015   History of shingles 05/2020   L low/mid back   Small bowel obstruction (HCC) 06/2014   2015, 2017, 2025.  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 Horris Lynn 02/2018-->retesting of thyroid  labs showed pt is now in HYPOthyroid phase of thyroiditis---likely will return to normal spontaneously.  F/u thyroid  testing has shown TSH gradually normalizing and T4 and T3 levels either a tiny bit low or normal (normal 05/30/09).      Past Surgical History:  Procedure Laterality Date   COLONOSCOPY  07/29/2020   normal, bx normal   LASIK  2003   TONSILLECTOMY     VASECTOMY      Family History  Problem Relation Age of Onset   Lymphoma Father 68       NHL; died age 41   Esophageal cancer Father    Colon cancer Neg Hx    Pancreatic cancer Neg Hx    Stomach cancer Neg Hx    Rectal cancer Neg Hx     Social History   Socioeconomic History   Marital status: Married    Spouse name: Not on file   Number of children: Not on file   Years of education: Not on file   Highest education level: Not on file  Occupational History   Not on file  Tobacco Use   Smoking status: Former    Current packs/day: 0.00    Types: Cigarettes    Quit date: 08/22/2001    Years since quitting: 22.4   Smokeless tobacco: Never  Vaping Use   Vaping status: Never Used  Substance and Sexual Activity   Alcohol use: Yes    Comment: occ   Drug use: No   Sexual activity: Not on file  Other Topics Concern   Not on file  Social History Narrative   Married, has one son and one daughter.   Relocated to [facility] 2006.      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   Lives in [place].     No Tob, 1 glass of wine with dinner.  No drugs.   Exercises 4 d/week--cardio and wt lifting.   Social Drivers of Corporate investment banker Strain: Not on file  Food Insecurity: Not on file  Transportation Needs: Not on file  Physical Activity: Not on file  Stress: Not on file  Social Connections: Unknown (06/08/2022)   Received from [facility]   Social Network    Social Network: Not on file  Intimate Partner Violence: Unknown (06/08/2022)   Received from [facility]   HITS    Physically Hurt: Not on file    Insult or Talk Down To: Not on file    Threaten Physical Harm: Not on file    Scream or Curse: Not on file    Outpatient Medications Prior to Visit  Medication Sig Dispense Refill   ADDERALL XR 20 MG 24 hr capsule Take 20 mg by mouth daily.     Cholecalciferol (VITAMIN D3) 125 MCG (5000 UT) TABS  Take by mouth daily.     Omega-3 Fatty Acids (FISH OIL BURP-LESS PO) Take 1 capsule by mouth daily.     Zinc 30 MG CAPS Take by mouth.     neomycin -polymyxin-hydrocortisone  (CORTISPORIN) OTIC solution Place 3 drops into the left ear 3 (three) times daily. (Patient not taking: Reported on 02/07/2024) 10 mL 2   fluticasone  (FLONASE ) 50 MCG/ACT nasal spray Place 2 sprays into both nostrils in the morning and at bedtime. (Patient not taking: Reported on 09/11/2023) 16 g 6   No facility-administered medications prior to visit.    No Known Allergies  Review of Systems  Constitutional:  Negative for appetite change, chills, fatigue and fever.  HENT:  Negative for congestion, dental problem, ear pain and sore throat.   Eyes:  Negative for discharge, redness and visual disturbance.  Respiratory:  Negative for cough, chest tightness, shortness of breath and wheezing.   Cardiovascular:  Negative for chest pain, palpitations and leg swelling.  Gastrointestinal:  Negative for abdominal pain,  blood in stool, diarrhea, nausea and vomiting.  Genitourinary:  Negative for difficulty urinating, dysuria, flank pain, frequency, hematuria and urgency.  Musculoskeletal:  Negative for arthralgias, back pain, joint swelling, myalgias and neck stiffness.  Skin:  Negative for pallor and rash.  Neurological:  Negative for dizziness, speech difficulty, weakness and headaches.  Hematological:  Negative for adenopathy. Does not bruise/bleed easily.  Psychiatric/Behavioral:  Negative for confusion and sleep disturbance. The patient is not nervous/anxious.     PE;    02/07/2024    9:27 AM 09/11/2023    2:06 PM 02/01/2023    8:56 AM  Vitals with BMI  Height 6' 1.75  6' 2  Weight 214 lbs 13 oz 212 lbs 10 oz 214 lbs 10 oz  BMI 27.77  27.54  Systolic 126 130 161  Diastolic 84 88 86  Pulse 66 76 61   Gen: Alert, well appearing.  Patient is oriented to person, place, time, and situation. AFFECT: pleasant, lucid thought and speech. ENT: Ears: EACs clear, normal epithelium.  TMs with good light reflex and landmarks bilaterally.  Eyes: no injection, icteris, swelling, or exudate.  EOMI, PERRLA. Nose: no drainage or turbinate edema/swelling.  No injection or focal lesion.  Mouth: lips without lesion/swelling.  Oral mucosa pink and moist.  Dentition intact and without obvious caries or gingival swelling.  Oropharynx without erythema, exudate, or swelling.  Neck: supple/nontender.  No LAD, mass, or TM.  Carotid pulses 2+ bilaterally, without bruits. CV: RRR, no m/r/g.   LUNGS: CTA bilat, nonlabored resps, good aeration in all lung fields. ABD: soft, NT, ND, BS normal.  No hepatospenomegaly or mass.  No bruits. EXT: no clubbing, cyanosis, or edema.  Musculoskeletal: no joint swelling, erythema, warmth, or tenderness.  ROM of all joints intact. Skin - no sores or suspicious lesions or rashes or color changes  Pertinent labs:  Lab Results  Component Value Date   TSH 4.01 02/01/2023   Lab Results   Component Value Date   WBC 6.3 09/11/2023   HGB 15.6 09/11/2023   HCT 46.7 09/11/2023   MCV 90.3 09/11/2023   PLT 186 09/11/2023   Lab Results  Component Value Date   CREATININE 1.28 09/11/2023   BUN 17 09/11/2023   NA 137 09/11/2023   K 4.7 09/11/2023   CL 101 09/11/2023   CO2 30 09/11/2023   Lab Results  Component Value Date   ALT 21 09/11/2023   AST 20 09/11/2023   ALKPHOS  71 05/25/2021   BILITOT 1.2 09/11/2023   Lab Results  Component Value Date   CHOL 212 (H) 02/01/2023   Lab Results  Component Value Date   HDL 45 02/01/2023   Lab Results  Component Value Date   LDLCALC 139 (H) 02/01/2023   Lab Results  Component Value Date   TRIG 150 (H) 02/01/2023   Lab Results  Component Value Date   CHOLHDL 4.7 02/01/2023   ASSESSMENT AND PLAN:   Health maintenance exam: Reviewed age and gender appropriate health maintenance issues (prudent diet, regular exercise, health risks of tobacco and excessive alcohol, use of seatbelts, fire alarms in home, use of sunscreen).  Also reviewed age and gender appropriate health screening as well as vaccine recommendations. Vaccines:  ALL UTD Labs: fasting HP labs ordered as well as free T4 and T3 total (pt with hx of subacute thyroiditis). Prostate ca screening: average risk patient= as per latest guidelines, start screening at 41 yrs of age. Colon ca screening: he got colonoscopy 07/2020 for chronic diarrhea-->normal.  Rpt 10 yrs.  An After Visit Summary was printed and given to the patient.  FOLLOW UP:  Return in about 1 year (around 02/06/2025) for annual CPE (fasting).  Signed:  Arletha Lady, MD           02/07/2024

## 2024-02-08 ENCOUNTER — Ambulatory Visit: Payer: Self-pay | Admitting: Family Medicine

## 2024-02-08 ENCOUNTER — Encounter: Payer: Self-pay | Admitting: Family Medicine

## 2024-02-08 LAB — CBC WITH DIFFERENTIAL/PLATELET
Absolute Lymphocytes: 1236 {cells}/uL (ref 850–3900)
Absolute Monocytes: 569 {cells}/uL (ref 200–950)
Basophils Absolute: 38 {cells}/uL (ref 0–200)
Basophils Relative: 0.8 %
Eosinophils Absolute: 89 {cells}/uL (ref 15–500)
Eosinophils Relative: 1.9 %
HCT: 46 % (ref 38.5–50.0)
Hemoglobin: 15.1 g/dL (ref 13.2–17.1)
MCH: 31 pg (ref 27.0–33.0)
MCHC: 32.8 g/dL (ref 32.0–36.0)
MCV: 94.5 fL (ref 80.0–100.0)
MPV: 11.5 fL (ref 7.5–12.5)
Monocytes Relative: 12.1 %
Neutro Abs: 2768 {cells}/uL (ref 1500–7800)
Neutrophils Relative %: 58.9 %
Platelets: 178 10*3/uL (ref 140–400)
RBC: 4.87 10*6/uL (ref 4.20–5.80)
RDW: 13.5 % (ref 11.0–15.0)
Total Lymphocyte: 26.3 %
WBC: 4.7 10*3/uL (ref 3.8–10.8)

## 2024-02-08 LAB — COMPREHENSIVE METABOLIC PANEL WITH GFR
AG Ratio: 2.3 (calc) (ref 1.0–2.5)
ALT: 20 U/L (ref 9–46)
AST: 21 U/L (ref 10–40)
Albumin: 5 g/dL (ref 3.6–5.1)
Alkaline phosphatase (APISO): 69 U/L (ref 36–130)
BUN: 17 mg/dL (ref 7–25)
CO2: 31 mmol/L (ref 20–32)
Calcium: 9.8 mg/dL (ref 8.6–10.3)
Chloride: 102 mmol/L (ref 98–110)
Creat: 1.17 mg/dL (ref 0.60–1.29)
Globulin: 2.2 g/dL (ref 1.9–3.7)
Glucose, Bld: 96 mg/dL (ref 65–99)
Potassium: 4.3 mmol/L (ref 3.5–5.3)
Sodium: 139 mmol/L (ref 135–146)
Total Bilirubin: 1.1 mg/dL (ref 0.2–1.2)
Total Protein: 7.2 g/dL (ref 6.1–8.1)
eGFR: 76 mL/min/{1.73_m2} (ref >=60)

## 2024-02-08 LAB — LIPID PANEL
Cholesterol: 239 mg/dL — ABNORMAL HIGH (ref <200)
HDL: 46 mg/dL (ref >=40)
LDL Cholesterol (Calc): 149 mg/dL — ABNORMAL HIGH
Non-HDL Cholesterol (Calc): 193 mg/dL — ABNORMAL HIGH (ref <130)
Total CHOL/HDL Ratio: 5.2 (calc) — ABNORMAL HIGH (ref <5.0)
Triglycerides: 271 mg/dL — ABNORMAL HIGH (ref <150)

## 2024-02-08 LAB — TSH: TSH: 4.09 m[IU]/L (ref 0.40–4.50)

## 2024-02-08 LAB — T3: T3, Total: 97 ng/dL (ref 76–181)

## 2024-02-08 LAB — T4, FREE: Free T4: 1.3 ng/dL (ref 0.8–1.8)

## 2025-02-07 ENCOUNTER — Encounter: Admitting: Family Medicine
